# Patient Record
Sex: Female | Born: 1950 | Race: White | Hispanic: No | Smoking: Former smoker
Health system: Southern US, Community
[De-identification: ages and names within clinical notes are randomized; demographics above are authoritative.]

## PROBLEM LIST (undated history)

## (undated) DIAGNOSIS — R112 Nausea with vomiting, unspecified: Secondary | ICD-10-CM

## (undated) DIAGNOSIS — E7800 Pure hypercholesterolemia, unspecified: Secondary | ICD-10-CM

## (undated) DIAGNOSIS — F419 Anxiety disorder, unspecified: Secondary | ICD-10-CM

## (undated) DIAGNOSIS — Z87442 Personal history of urinary calculi: Secondary | ICD-10-CM

## (undated) DIAGNOSIS — Z8669 Personal history of other diseases of the nervous system and sense organs: Secondary | ICD-10-CM

## (undated) DIAGNOSIS — Z9889 Other specified postprocedural states: Secondary | ICD-10-CM

## (undated) DIAGNOSIS — I10 Essential (primary) hypertension: Secondary | ICD-10-CM

## (undated) HISTORY — PX: TONSILLECTOMY: SUR1361

## (undated) HISTORY — PX: APPENDECTOMY: SHX54

## (undated) HISTORY — PX: ABDOMINAL HYSTERECTOMY: SHX81

---

## 2013-02-18 ENCOUNTER — Other Ambulatory Visit (HOSPITAL_COMMUNITY): Payer: Self-pay | Admitting: Podiatry

## 2013-02-18 DIAGNOSIS — M25571 Pain in right ankle and joints of right foot: Secondary | ICD-10-CM

## 2013-02-18 DIAGNOSIS — M79671 Pain in right foot: Secondary | ICD-10-CM

## 2013-02-19 ENCOUNTER — Ambulatory Visit (HOSPITAL_COMMUNITY): Payer: Self-pay

## 2013-02-20 ENCOUNTER — Other Ambulatory Visit: Payer: Self-pay | Admitting: Podiatry

## 2013-02-20 ENCOUNTER — Encounter (HOSPITAL_COMMUNITY): Payer: Self-pay | Admitting: Pharmacy Technician

## 2013-02-21 NOTE — Addendum Note (Signed)
Addended by: Salmaan Patchin on: 02/21/2013 01:02 PM   Modules accepted: Orders  

## 2013-02-22 ENCOUNTER — Encounter (HOSPITAL_COMMUNITY): Payer: Self-pay

## 2013-02-22 ENCOUNTER — Ambulatory Visit (HOSPITAL_COMMUNITY)
Admission: RE | Admit: 2013-02-22 | Discharge: 2013-02-22 | Disposition: A | Discharge status: Home / Self Care | Payer: 59 | Source: Ambulatory Visit | Attending: Podiatry | Admitting: Podiatry

## 2013-02-22 ENCOUNTER — Encounter (HOSPITAL_COMMUNITY)
Admission: RE | Admit: 2013-02-22 | Discharge: 2013-02-22 | Disposition: A | Discharge status: Home / Self Care | Payer: 59 | Source: Ambulatory Visit | Attending: Podiatry | Admitting: Podiatry

## 2013-02-22 ENCOUNTER — Other Ambulatory Visit: Payer: Self-pay

## 2013-02-22 DIAGNOSIS — Z01818 Encounter for other preprocedural examination: Secondary | ICD-10-CM | POA: Insufficient documentation

## 2013-02-22 DIAGNOSIS — S93499A Sprain of other ligament of unspecified ankle, initial encounter: Secondary | ICD-10-CM | POA: Insufficient documentation

## 2013-02-22 DIAGNOSIS — X58XXXA Exposure to other specified factors, initial encounter: Secondary | ICD-10-CM | POA: Insufficient documentation

## 2013-02-22 DIAGNOSIS — Z01810 Encounter for preprocedural cardiovascular examination: Secondary | ICD-10-CM | POA: Insufficient documentation

## 2013-02-22 HISTORY — DX: Anxiety disorder, unspecified: F41.9

## 2013-02-22 HISTORY — DX: Nausea with vomiting, unspecified: R11.2

## 2013-02-22 HISTORY — DX: Essential (primary) hypertension: I10

## 2013-02-22 HISTORY — DX: Other specified postprocedural states: Z98.890

## 2013-02-22 HISTORY — DX: Pure hypercholesterolemia, unspecified: E78.00

## 2013-02-22 HISTORY — DX: Personal history of other diseases of the nervous system and sense organs: Z86.69

## 2013-02-22 LAB — BASIC METABOLIC PANEL
BUN: 14 mg/dL (ref 6–23)
Creatinine, Ser: 0.71 mg/dL (ref 0.50–1.10)
GFR calc Af Amer: >90 mL/min (ref >90)
GFR calc non Af Amer: >90 mL/min (ref >90)

## 2013-02-22 MED ORDER — CYCLOPENTOLATE-PHENYLEPHRINE OP SOLN OPTIME - NO CHARGE
OPHTHALMIC | Status: AC
  Filled 2013-02-22: qty 2

## 2013-02-22 MED ORDER — LIDOCAINE HCL 3.5 % OP GEL
OPHTHALMIC | Status: AC
  Filled 2013-02-22: qty 5

## 2013-02-22 MED ORDER — TETRACAINE HCL 0.5 % OP SOLN
OPHTHALMIC | Status: AC
  Filled 2013-02-22: qty 2

## 2013-02-22 MED ORDER — LIDOCAINE HCL (PF) 1 % IJ SOLN
INTRAMUSCULAR | Status: AC
  Filled 2013-02-22: qty 2

## 2013-02-22 MED ORDER — NEOMYCIN-POLYMYXIN-DEXAMETH 3.5-10000-0.1 OP OINT
TOPICAL_OINTMENT | OPHTHALMIC | Status: AC
  Filled 2013-02-22: qty 3.5

## 2013-02-22 NOTE — Patient Instructions (Signed)
Krista Fuller  02/22/2013   Your procedure is scheduled on:  Monday, 02/25/13  Report to [facility] at [place] [facility].  Call this number if you have problems the morning of surgery: [phone]   Remember:   Do not eat food or drink liquids after midnight.   Take these medicines the morning of surgery with A SIP OF WATER: ramipril (altace) and xanax   Do not wear jewelry, make-up or nail polish.  Do not wear lotions, powders, or perfumes. You may wear deodorant.  Do not shave 48 hours prior to surgery. Men may shave face and neck.  Do not bring valuables to the hospital.  [facility] is not responsible for any belongings or valuables.  Contacts, dentures or bridgework may not be worn into surgery.  Leave suitcase in the car. After surgery it may be brought to your room.  For patients admitted to the hospital, checkout time is 11:00 AM the day of  discharge.   Patients discharged the day of surgery will not be allowed to drive  home.  Name and phone number of your driver: brother  Special Instructions: Shower using CHG 2 nights before surgery and the night before surgery.  If you shower the day of surgery use CHG.  Use special wash - you have one bottle of CHG for all showers.  You should use approximately 1/3 of the bottle for each shower.   Please read over the following fact sheets that you were given: Pain Booklet, Surgical Site Infection Prevention, Anesthesia Post-op Instructions and Care and Recovery After Surgery   Complete Achilles Tendon Rupture Tendons are the tough, fibrous, and stretchy (elastic) tissues that connect muscle to bone. The Achilles tendon is the large cord-like structure (tendon) in the back of the leg just above the foot. It attaches the large muscles of the lower leg to the heel bone. You can feel this as the large cord just above the heel. The diagnosis of complete Achilles tendon tear (rupture) is made by examination. You are not able to stand up on the toes of the  injured side with this injury. X-rays will determine the extent of the injury. Surgical repair with casting is necessary with complete rupture of the tendon. Surgery allows the surgeon to put the tendon back together. The cast is used to allow the repair time to heal. The injury may be casted or immobilized for 6 to 10 weeks. Immobilization means that the tendon injured is kept in position with a cast or splint. Once your caregiver feels you have healed well enough, he or she will provide exercises you can do to make the injured tendon feel better (rehabilitate). HOME CARE INSTRUCTIONS   Apply ice to the injury for 15-20 minutes, 3-4 times per day. Put the ice in a plastic bag and place a towel between the bag of ice and your skin, splint, or immobilization device.  Use crutches and move about only as instructed.  Keep the leg elevated above the level of the heart (the center of the chest) at all times when not using the bathroom. Do not dangle the leg over a chair, couch, or bed. When lying down, elevate your leg on a few pillows. Elevation prevents swelling and reduces pain.  Avoid use other than gentle range of motion of the toes.  Do not drive a car until your caregiver specifically tells you it is safe to do so.  Only take over-the-counter or prescription medicines for pain, discomfort, or fever  as directed by your caregiver.  If your caregiver has given you a follow-up appointment, it is very important to keep that appointment. Not keeping the appointment could result in a chronic or permanent injury, pain, and disability. If there is any problem keeping the appointment, you must call back to this facility for assistance. SEEK MEDICAL CARE IF:   Your pain and swelling increase, or pain is uncontrolled with medications.  You develop new unexplained problems (symptoms) or an increase of the symptoms that brought you to your caregiver.  You develop an inability to move your toes or foot,  develop warmth and swelling in your foot, or begin running an unexplained fever. MAKE SURE YOU:   Understand these instructions.  Will watch your condition.  Will get help right away if you are not doing well or get worse. Document Released: 03/02/2005 Document Revised: 08/15/2011 Document Reviewed: 12/25/2007 [facility] Patient Information 2014 [place], [place]. Achilles Tendon Repair  Care After AFTER THE PROCEDURE  You will be taken to the recovery area where a nurse will watch and check your progress. Once you are awake, stable, and taking fluids well, if there are no other problems, you will be allowed to go home.  If this was done as a same day surgery, make sure to have a responsible adult with you for the first 24 hours following your surgery.  Do not drink alcohol, drive a car, use public transportation, or sign important papers for at least one day after surgery.  Do not resume physical activities until directed by your caregiver and surgeon.  Apply ice to the operative site for 15-20 minutes, 3-4 times per day for the first 2-3 days, or as directed. Put the ice in a plastic bag and place a towel between the bag of ice and your skin, splint or cast.  Only take over-the-counter or prescription medicines for pain, discomfort, or fever as directed by your caregiver.  You may resume your normal diet as directed.  Change your dressings as directed.  Use crutches and move about only as instructed.  Keep leg raised above the level of the heart (the center of the chest) at all times when not using the bathroom, etc. Do not dangle the leg over a chair, couch or bed. When lying down, raise your leg on a couple of pillows. Keeping your leg up this way prevents swelling and reduces pain.  Avoid use of your leg other than gentle range of motion with your toes. SEEK MEDICAL CARE IF:   There is redness, swelling, or increasing pain in the wound.  There is pus coming from the  wound.  There is drainage from a wound lasting longer than one day.  An unexplained oral temperature above 102 F (38.9 C) develops.  You notice a bad smell coming from the wound or dressing.  The wound edges break apart after sutures or staples have been removed.  You develop continuing nausea or vomiting. SEEK IMMEDIATE MEDICAL CARE IF:   Your pain and swelling increase or pain is uncontrolled with medicine.  You develop new, unexplained symptoms.  You have trouble moving or cannot move your toes or foot, or develop warmth and swelling in your foot, or begin running an unexplained temperature.  You develop a rash.  You have a hard time breathing.  You develop or feel you are developing any reaction or side effects to the medicines given. Document Released: 01/27/2004 Document Revised: 08/15/2011 Document Reviewed: 03/12/2008 [employer] Patient Information 2014 [place]  [place]  PATIENT INSTRUCTIONS POST-ANESTHESIA  IMMEDIATELY FOLLOWING SURGERY:  Do not drive or operate machinery for the first twenty four hours after surgery.  Do not make any important decisions for twenty four hours after surgery or while taking narcotic pain medications or sedatives.  If you develop intractable nausea and vomiting or a severe headache please notify your doctor immediately.  FOLLOW-UP:  Please make an appointment with your surgeon as instructed. You do not need to follow up with anesthesia unless specifically instructed to do so.  WOUND CARE INSTRUCTIONS (if applicable):  Keep a dry clean dressing on the anesthesia/puncture wound site if there is drainage.  Once the wound has quit draining you may leave it open to air.  Generally you should leave the bandage intact for twenty four hours unless there is drainage.  If the epidural site drains for more than 36-48 hours please call the anesthesia department.  QUESTIONS?:  Please feel free to call your physician or the hospital operator if you have  any questions, and they will be happy to assist you.

## 2013-02-25 ENCOUNTER — Ambulatory Visit (HOSPITAL_COMMUNITY): Payer: 59

## 2013-02-25 ENCOUNTER — Encounter (HOSPITAL_COMMUNITY): Payer: Self-pay | Admitting: Anesthesiology

## 2013-02-25 ENCOUNTER — Encounter (HOSPITAL_COMMUNITY): Payer: Self-pay | Admitting: *Deleted

## 2013-02-25 ENCOUNTER — Ambulatory Visit (HOSPITAL_COMMUNITY)
Admission: RE | Admit: 2013-02-25 | Discharge: 2013-02-25 | Disposition: A | Discharge status: Home / Self Care | Payer: 59 | Source: Ambulatory Visit | Attending: Podiatry | Admitting: Podiatry

## 2013-02-25 ENCOUNTER — Ambulatory Visit (HOSPITAL_COMMUNITY): Payer: 59 | Admitting: Anesthesiology

## 2013-02-25 ENCOUNTER — Encounter (HOSPITAL_COMMUNITY)
Admission: RE | Disposition: A | Discharge status: Home / Self Care | Payer: Self-pay | Source: Ambulatory Visit | Attending: Podiatry

## 2013-02-25 DIAGNOSIS — X58XXXA Exposure to other specified factors, initial encounter: Secondary | ICD-10-CM | POA: Insufficient documentation

## 2013-02-25 DIAGNOSIS — S86011D Strain of right Achilles tendon, subsequent encounter: Secondary | ICD-10-CM

## 2013-02-25 DIAGNOSIS — S93499A Sprain of other ligament of unspecified ankle, initial encounter: Secondary | ICD-10-CM | POA: Insufficient documentation

## 2013-02-25 DIAGNOSIS — I10 Essential (primary) hypertension: Secondary | ICD-10-CM | POA: Insufficient documentation

## 2013-02-25 HISTORY — PX: ACHILLES TENDON SURGERY: SHX542

## 2013-02-25 SURGERY — REPAIR, TENDON, ACHILLES
Anesthesia: General | Site: Foot | Laterality: Right | Wound class: Clean

## 2013-02-25 MED ORDER — CEFAZOLIN SODIUM-DEXTROSE 2-3 GM-% IV SOLR
2.0000 g | INTRAVENOUS | Status: AC
  Administered 2013-02-25: 2 g via INTRAVENOUS

## 2013-02-25 MED ORDER — FENTANYL CITRATE 0.05 MG/ML IJ SOLN
INTRAMUSCULAR | Status: DC | PRN
  Administered 2013-02-25 (×2): 50 ug via INTRAVENOUS
  Administered 2013-02-25 (×3): 25 ug via INTRAVENOUS
  Administered 2013-02-25 (×2): 50 ug via INTRAVENOUS
  Administered 2013-02-25 (×3): 25 ug via INTRAVENOUS

## 2013-02-25 MED ORDER — FENTANYL CITRATE 0.05 MG/ML IJ SOLN
INTRAMUSCULAR | Status: AC
  Filled 2013-02-25: qty 2

## 2013-02-25 MED ORDER — PROPOFOL 10 MG/ML IV EMUL
INTRAVENOUS | Status: AC
  Filled 2013-02-25: qty 20

## 2013-02-25 MED ORDER — SODIUM CHLORIDE 0.9 % IR SOLN
Status: DC | PRN
  Administered 2013-02-25: 1000 mL

## 2013-02-25 MED ORDER — SCOPOLAMINE 1 MG/3DAYS TD PT72
MEDICATED_PATCH | TRANSDERMAL | Status: AC
  Filled 2013-02-25: qty 1

## 2013-02-25 MED ORDER — SCOPOLAMINE 1 MG/3DAYS TD PT72
1.0000 | MEDICATED_PATCH | Freq: Once | TRANSDERMAL | Status: DC
  Administered 2013-02-25: 1.5 mg via TRANSDERMAL

## 2013-02-25 MED ORDER — FENTANYL CITRATE 0.05 MG/ML IJ SOLN
25.0000 ug | INTRAMUSCULAR | Status: DC | PRN
  Administered 2013-02-25 (×3): 50 ug via INTRAVENOUS

## 2013-02-25 MED ORDER — BUPIVACAINE HCL (PF) 0.5 % IJ SOLN
INTRAMUSCULAR | Status: AC
  Filled 2013-02-25: qty 30

## 2013-02-25 MED ORDER — ONDANSETRON HCL 4 MG/2ML IJ SOLN: 4.0000 mg | Freq: Once | INTRAMUSCULAR | Status: DC | PRN

## 2013-02-25 MED ORDER — ONDANSETRON HCL 4 MG/2ML IJ SOLN
4.0000 mg | Freq: Once | INTRAMUSCULAR | Status: AC
  Administered 2013-02-25: 4 mg via INTRAVENOUS

## 2013-02-25 MED ORDER — DEXAMETHASONE SODIUM PHOSPHATE 4 MG/ML IJ SOLN: 4.0000 mg | Freq: Once | INTRAMUSCULAR | Status: DC

## 2013-02-25 MED ORDER — ONDANSETRON HCL 4 MG/2ML IJ SOLN
INTRAMUSCULAR | Status: AC
  Filled 2013-02-25: qty 2

## 2013-02-25 MED ORDER — PROPOFOL 10 MG/ML IV BOLUS
INTRAVENOUS | Status: DC | PRN
  Administered 2013-02-25: 150 mg via INTRAVENOUS

## 2013-02-25 MED ORDER — MIDAZOLAM HCL 2 MG/2ML IJ SOLN
INTRAMUSCULAR | Status: AC
  Filled 2013-02-25: qty 2

## 2013-02-25 MED ORDER — LIDOCAINE HCL (CARDIAC) 20 MG/ML IV SOLN
INTRAVENOUS | Status: DC | PRN
  Administered 2013-02-25: 30 mg via INTRAVENOUS

## 2013-02-25 MED ORDER — DEXAMETHASONE SODIUM PHOSPHATE 4 MG/ML IJ SOLN
INTRAMUSCULAR | Status: AC
  Filled 2013-02-25: qty 1

## 2013-02-25 MED ORDER — ROCURONIUM BROMIDE 50 MG/5ML IV SOLN
INTRAVENOUS | Status: AC
  Filled 2013-02-25: qty 1

## 2013-02-25 MED ORDER — BUPIVACAINE HCL (PF) 0.5 % IJ SOLN
INTRAMUSCULAR | Status: DC | PRN
  Administered 2013-02-25: 20 mL

## 2013-02-25 MED ORDER — GLYCOPYRROLATE 0.2 MG/ML IJ SOLN
INTRAMUSCULAR | Status: AC
  Filled 2013-02-25: qty 2

## 2013-02-25 MED ORDER — MIDAZOLAM HCL 2 MG/2ML IJ SOLN
1.0000 mg | INTRAMUSCULAR | Status: DC | PRN
Start: 2013-02-25 — End: 2013-02-25
  Administered 2013-02-25: 2 mg via INTRAVENOUS

## 2013-02-25 MED ORDER — LACTATED RINGERS IV SOLN
INTRAVENOUS | Status: DC
  Administered 2013-02-25: 07:00:00 via INTRAVENOUS

## 2013-02-25 MED ORDER — FENTANYL CITRATE 0.05 MG/ML IJ SOLN
INTRAMUSCULAR | Status: AC
  Filled 2013-02-25: qty 5

## 2013-02-25 MED ORDER — ROCURONIUM BROMIDE 100 MG/10ML IV SOLN
INTRAVENOUS | Status: DC | PRN
  Administered 2013-02-25: 35 mg via INTRAVENOUS

## 2013-02-25 MED ORDER — NEOSTIGMINE METHYLSULFATE 1 MG/ML IJ SOLN
INTRAMUSCULAR | Status: AC
  Filled 2013-02-25: qty 1

## 2013-02-25 MED ORDER — CEFAZOLIN SODIUM 1-5 GM-% IV SOLN
INTRAVENOUS | Status: AC
  Filled 2013-02-25: qty 100

## 2013-02-25 MED ORDER — LIDOCAINE HCL (PF) 1 % IJ SOLN
INTRAMUSCULAR | Status: AC
  Filled 2013-02-25: qty 5

## 2013-02-25 SURGICAL SUPPLY — 54 items
BAG HAMPER (MISCELLANEOUS) ×2 IMPLANT
BANDAGE CONFORM 2  STR LF (GAUZE/BANDAGES/DRESSINGS) ×2 IMPLANT
BANDAGE ELASTIC 4 VELCRO NS (GAUZE/BANDAGES/DRESSINGS) ×2 IMPLANT
BANDAGE ELASTIC 6 VELCRO NS (GAUZE/BANDAGES/DRESSINGS) ×2 IMPLANT
BANDAGE ESMARK 4X12 BL STRL LF (DISPOSABLE) ×1 IMPLANT
BANDAGE GAUZE ELAST BULKY 4 IN (GAUZE/BANDAGES/DRESSINGS) ×2 IMPLANT
BENZOIN TINCTURE PRP APPL 2/3 (GAUZE/BANDAGES/DRESSINGS) ×2 IMPLANT
BLADE AVERAGE 25X9 (BLADE) ×2 IMPLANT
BLADE SURG 15 STRL LF DISP TIS (BLADE) ×2 IMPLANT
BLADE SURG 15 STRL SS (BLADE) ×2
BNDG ESMARK 4X12 BLUE STRL LF (DISPOSABLE) ×2
CHLORAPREP W/TINT 26ML (MISCELLANEOUS) ×2 IMPLANT
CLOTH BEACON ORANGE TIMEOUT ST (SAFETY) ×2 IMPLANT
COVER LIGHT HANDLE STERIS (MISCELLANEOUS) ×4 IMPLANT
CUFF TOURNIQUET SINGLE 34IN LL (TOURNIQUET CUFF) ×2 IMPLANT
DECANTER SPIKE VIAL GLASS SM (MISCELLANEOUS) ×4 IMPLANT
DRAPE OEC MINIVIEW 54X84 (DRAPES) ×2 IMPLANT
DRSG ADAPTIC 3X8 NADH LF (GAUZE/BANDAGES/DRESSINGS) ×2 IMPLANT
ELECT REM PT RETURN 9FT ADLT (ELECTROSURGICAL) ×2
ELECTRODE REM PT RTRN 9FT ADLT (ELECTROSURGICAL) ×1 IMPLANT
GLOVE BIO SURGEON STRL SZ7.5 (GLOVE) ×4 IMPLANT
GLOVE BIOGEL PI IND STRL 7.0 (GLOVE) ×3 IMPLANT
GLOVE BIOGEL PI INDICATOR 7.0 (GLOVE) ×3
GLOVE ECLIPSE 7.0 STRL STRAW (GLOVE) ×2 IMPLANT
GLOVE SS BIOGEL STRL SZ 6.5 (GLOVE) ×1 IMPLANT
GLOVE SUPERSENSE BIOGEL SZ 6.5 (GLOVE) ×1
GOWN STRL REIN XL XLG (GOWN DISPOSABLE) ×8 IMPLANT
GRAFT ACHILLES TENDON (Bone Implant) ×2 IMPLANT
IMPL SYS BIOCOMP ACH SPEED (Anchor) ×1 IMPLANT
IMPLANT SYS BIOCOMP ACH SPEED (Anchor) ×2 IMPLANT
KIT ROOM TURNOVER AP CYSTO (KITS) ×2 IMPLANT
MANIFOLD NEPTUNE II (INSTRUMENTS) ×2 IMPLANT
NEEDLE HYPO 27GX1-1/4 (NEEDLE) ×4 IMPLANT
NEEDLE MAYO 6 CRC TAPER PT (NEEDLE) ×2 IMPLANT
NS IRRIG 1000ML POUR BTL (IV SOLUTION) ×2 IMPLANT
PACK BASIC LIMB (CUSTOM PROCEDURE TRAY) ×2 IMPLANT
PAD ARMBOARD 7.5X6 YLW CONV (MISCELLANEOUS) ×2 IMPLANT
PAD CAST 4YDX4 CTTN HI CHSV (CAST SUPPLIES) ×2 IMPLANT
PADDING CAST COTTON 4X4 STRL (CAST SUPPLIES) ×2
RASP SM TEAR CROSS CUT (RASP) ×2 IMPLANT
SET BASIN LINEN APH (SET/KITS/TRAYS/PACK) ×2 IMPLANT
SPLINT J IMMOBILIZER 4X20FT (CAST SUPPLIES) ×1 IMPLANT
SPLINT J PLASTER J 4INX20Y (CAST SUPPLIES) ×1
SPONGE GAUZE 4X4 12PLY (GAUZE/BANDAGES/DRESSINGS) ×2 IMPLANT
SPONGE LAP 18X18 X RAY DECT (DISPOSABLE) ×4 IMPLANT
STRIP CLOSURE SKIN 1/2X4 (GAUZE/BANDAGES/DRESSINGS) ×2 IMPLANT
SUT FIBERWIRE #2 38 T-5 BLUE (SUTURE) ×2
SUT PROLENE 4 0 PS 2 18 (SUTURE) ×2 IMPLANT
SUT VIC AB 3-0 SH 27 (SUTURE) ×1
SUT VIC AB 3-0 SH 27X BRD (SUTURE) ×1 IMPLANT
SUT VIC AB 4-0 PS2 27 (SUTURE) ×2 IMPLANT
SUT VICRYL AB 3-0 FS1 BRD 27IN (SUTURE) ×2 IMPLANT
SUTURE FIBERWR #2 38 T-5 BLUE (SUTURE) ×1 IMPLANT
SYR CONTROL 10ML LL (SYRINGE) ×4 IMPLANT

## 2013-02-25 NOTE — Op Note (Signed)
OPERATIVE NOTE  DATE OF PROCEDURE:  02/25/2013  SURGEON:   Dallas Schimke, DPM  OR STAFF:   Circulator: Lennox Pippins, RN Scrub Person: Diana Eves, CST RN First Assistant: Eliane Decree Page, RN OR Clinical Technician: Raelyn Number, OCT   PREOPERATIVE DIAGNOSIS:   Achilles tendon rupture, right foot.  POSTOPERATIVE DIAGNOSIS: Same  PROCEDURE: Repair of Achilles tendon rupture with allograft, right foot.  ANESTHESIA:  General   HEMOSTASIS:   Pneumatic thigh tourniquet set at 300 mmHg  ESTIMATED BLOOD LOSS:   Minimal (<5 cc)  MATERIALS USED:  Achilles tendon allograft Arthrex SpeedBridge  INJECTABLES: Marcaine 0.5% plain; 20mL  PATHOLOGY:   Achilles tendon with calcification  COMPLICATIONS:   None  INDICATIONS:  Achilles tendon rupture of the right lower extremity as confirmed by MRI.  DESCRIPTION OF THE PROCEDURE:   The patient was brought to the operating room.  After general anesthesia was administered, the patient was placed in the semi-prone position.  All bony prominences were well-padded.  A pneumatic thigh tourniquet was placed about the patient's right thigh.  The foot was scrubbed, prepped and draped in usual sterile manner.  Attention was directed to the posterior aspect of the right right lower leg.  0.5% Marcaine plain was injected proximal to the surgical site.  A linear longitudinal incision was then made along the posterior aspect of the right lower leg and heel.  Dissection was continued deep down to the level of the peritenon.  An incision was made in the peritenon.  The peritenon was reflected thus exposing the achilles tendon.  The Achilles tendon was found to have ruptured from its point of insertion along the posterior aspect of calcaneus.  The ankle was placed in neutral position and the Achilles tendon was found to be retracted 3.6 centimeters from its insertion point.  The distal aspect of the tendon was comprised of degenerative  tendon with areas of ossification.  The diseased portion of tendon was debrided and sent to pathology for evaluation.  Following debridement of the diseased portion of tendon, the defect measured 4.9 cm.  It was determined that an allograft would be needed to span the defect.  An Achilles tendon graft was reconstituted following standard principles and techniques.  Attention was directed to the Achilles tendon.  The tendon was sutured with size 2 FiberWire in a modified Krakw stitch.  The allograft was measured and cut to fill the defect while placing the foot under physiologic tension.  The allograft was secured to the posterior aspect of the calcaneus using Arthrex SpeedBridge.  The graft was then sutured using size 2 FiberWire in a modified Krakw stitch.  The graft was secured to the Achilles tendon.  The repair was reinforced using size 2 FiberWire.  The wound was irrigated with copious amounts of sterile irrigant.  The peritenon was reapproximated using 3-0 Vicryl in a simple suture technique.  The subcutaneous structures reapproximated using 3-0 Vicryl in a simple suture technique.  The skin was reapproximated using 4-0 Vicryl in a running subcuticular manner.  The incision closure was reinforced with 3-0 Prolene.  The incision closure was reinforced with Steri-Strips.  A sterile compressive dressing was applied to the right foot.  The pneumatic thigh tourniquet was deflated and a prompt hyperemic response was noted to all digits of the right foot.  A posterior splint was then applied to the right lower extremity.  It was secured using 4 inch and 6 inch Ace wraps.  The  patient tolerated the procedure well.  The patient was then transferred to PACU with vital signs stable and vascular status intact to all toes of the operative foot.  Following a period of postoperative monitoring, the patient will be discharged home.

## 2013-02-25 NOTE — Brief Op Note (Addendum)
BRIEF OPERATIVE NOTE  SURGEON:   Dallas Schimke, DPM  OR STAFF:   Circulator: Lennox Pippins, RN Scrub Person: Diana Eves, CST RN First Assistant: Eliane Decree Page, RN OR Clinical Technician: Raelyn Number, OCT   PREOPERATIVE DIAGNOSIS:   Achilles tendon rupture, right foot.  POSTOPERATIVE DIAGNOSIS: Same  PROCEDURE: Repair of Achilles tendon rupture with allograft, right foot.  ANESTHESIA:  General   HEMOSTASIS:   Pneumatic thigh tourniquet set at 300 mmHg  ESTIMATED BLOOD LOSS:   Minimal (<5 cc)  MATERIALS USED:  Achilles tendon allograft  INJECTABLES: Marcaine 0.5% plain; 20mL  PATHOLOGY:   Achilles tendon with calcification Arthrex SpeedBridge  COMPLICATIONS:   None  DICTATION:  Office manager and Note written in [employer]

## 2013-02-25 NOTE — Anesthesia Preprocedure Evaluation (Signed)
Anesthesia Evaluation  Patient identified by MRN, date of birth, ID band Patient awake    Reviewed: Allergy & Precautions, H&P , NPO status , Patient's Chart, lab work & pertinent test results  History of Anesthesia Complications (+) PONV  Airway Mallampati: I TM Distance: >3 FB Neck ROM: Full    Dental  (+) Teeth Intact   Pulmonary neg pulmonary ROS,  breath sounds clear to auscultation        Cardiovascular hypertension, Pt. on medications Rhythm:Regular Rate:Normal     Neuro/Psych Anxiety    GI/Hepatic negative GI ROS,   Endo/Other    Renal/GU      Musculoskeletal   Abdominal   Peds  Hematology   Anesthesia Other Findings   Reproductive/Obstetrics                           Anesthesia Physical Anesthesia Plan  ASA: II  Anesthesia Plan: General   Post-op Pain Management:    Induction: Intravenous  Airway Management Planned: Oral ETT  Additional Equipment:   Intra-op Plan:   Post-operative Plan: Extubation in OR  Informed Consent: I have reviewed the patients History and Physical, chart, labs and discussed the procedure including the risks, benefits and alternatives for the proposed anesthesia with the patient or authorized representative who has indicated his/her understanding and acceptance.     Plan Discussed with:   Anesthesia Plan Comments:         Anesthesia Quick Evaluation

## 2013-02-25 NOTE — Anesthesia Postprocedure Evaluation (Signed)
  Anesthesia Post-op Note  Patient: Krista Fuller  Procedure(s) Performed: Procedure(s): REPAIR OF ACHILLES TENDON RUPTURE WITH APPLICATION OF GRAFT RIGHT FOOT  (Right)  Patient Location: PACU  Anesthesia Type:General  Level of Consciousness: awake, alert  and oriented  Airway and Oxygen Therapy: Patient Spontanous Breathing and Patient connected to face mask oxygen  Post-op Pain: none  Post-op Assessment: Post-op Vital signs reviewed, Patient's Cardiovascular Status Stable, Respiratory Function Stable, Patent Airway and No signs of Nausea or vomiting  Post-op Vital Signs: Reviewed and stable  Complications: No apparent anesthesia complications

## 2013-02-25 NOTE — Transfer of Care (Signed)
Immediate Anesthesia Transfer of Care Note  Patient: Krista Fuller  Procedure(s) Performed: Procedure(s): REPAIR OF ACHILLES TENDON RUPTURE WITH APPLICATION OF GRAFT RIGHT FOOT  (Right)  Patient Location: PACU  Anesthesia Type:General  Level of Consciousness: awake, alert  and oriented  Airway & Oxygen Therapy: Patient Spontanous Breathing and Patient connected to face mask oxygen  Post-op Assessment: Report given to PACU RN  Post vital signs: Reviewed and stable  Complications: No apparent anesthesia complications

## 2013-02-25 NOTE — H&P (Signed)
HISTORY AND PHYSICAL INTERVAL NOTE:  02/25/2013  7:26 AM  Krista Fuller  has presented today for surgery, with the diagnosis of achilles tendon rupture right foot.  The various methods of treatment have been discussed with the patient.  No guarantees were given.  After consideration of risks, benefits and other options for treatment, the patient has consented to surgery.  I have reviewed the patients' chart and labs.    Patient Vitals for the past 24 hrs:  BP Temp Temp src Pulse Resp SpO2 Height Weight  02/25/13 0656 121/83 mmHg 97.6 F (36.4 C) Oral 81 14 99 % 5\' 3"  (1.6 m) 82.101 kg (181 lb)    A history and physical examination was performed in my office.  The patient was reexamined.  There have been no changes to this history and physical examination.  Dallas Schimke, DPM

## 2013-02-25 NOTE — Anesthesia Procedure Notes (Signed)
Procedure Name: Intubation Date/Time: 02/25/2013 7:46 AM Performed by: Glynn Octave E Pre-anesthesia Checklist: Patient identified, Patient being monitored, Timeout performed, Emergency Drugs available and Suction available Patient Re-evaluated:Patient Re-evaluated prior to inductionOxygen Delivery Method: Circle System Utilized Preoxygenation: Pre-oxygenation with 100% oxygen Intubation Type: IV induction Ventilation: Mask ventilation without difficulty Laryngoscope Size: Mac and 3 Grade View: Grade I Tube type: Oral Tube size: 7.0 mm Number of attempts: 1 Airway Equipment and Method: stylet Placement Confirmation: ETT inserted through vocal cords under direct vision,  positive ETCO2 and breath sounds checked- equal and bilateral Secured at: 21 cm Tube secured with: Tape Dental Injury: Teeth and Oropharynx as per pre-operative assessment

## 2013-02-26 ENCOUNTER — Encounter (HOSPITAL_COMMUNITY): Payer: Self-pay | Admitting: Podiatry

## 2017-02-01 ENCOUNTER — Other Ambulatory Visit (HOSPITAL_COMMUNITY): Payer: Self-pay | Admitting: Podiatry

## 2017-02-01 DIAGNOSIS — S86312A Strain of muscle(s) and tendon(s) of peroneal muscle group at lower leg level, left leg, initial encounter: Secondary | ICD-10-CM

## 2017-02-01 DIAGNOSIS — S86012S Strain of left Achilles tendon, sequela: Secondary | ICD-10-CM

## 2017-02-09 ENCOUNTER — Ambulatory Visit (HOSPITAL_COMMUNITY): Payer: Self-pay

## 2017-02-10 ENCOUNTER — Other Ambulatory Visit: Payer: Self-pay | Admitting: Podiatry

## 2017-02-14 ENCOUNTER — Other Ambulatory Visit (HOSPITAL_COMMUNITY): Payer: Self-pay

## 2017-02-14 NOTE — Patient Instructions (Signed)
Krista PouJudy Fuller  02/14/2017     @[email or web address] @   Your procedure is scheduled on  02/22/2017   [facility] at  1030  A.M.  Call this number if you have problems the morning of surgery:  [phone]   Remember:  Do not eat food or drink liquids after midnight.  Take these medicines the morning of surgery with A SIP OF WATER  Xanax, altace.   Do not wear jewelry, make-up or nail polish.  Do not wear lotions, powders, or perfumes, or deoderant.  Do not shave 48 hours prior to surgery.  Men may shave face and neck.  Do not bring valuables to the hospital.  [facility] any belongings or valuables.  Contacts, dentures or bridgework may not be worn into surgery.  Leave your suitcase in the car.  After surgery it may be brought to your room.  For patients admitted to the hospital, discharge time will be determined by your treatment team.  Patients discharged the day of surgery will not be allowed to drive home.   Name and phone number of your driver:   family Special instructions:  None  Please read over the following fact sheets that you were given. Anesthesia Post-op Instructions and Care and Recovery After Surgery       Achilles Tendon Rupture Surgery The Achilles tendon is a rope-like cord of tissue that connects the lower leg muscles to the heel. Achilles tendon repair is a surgery to repair an Achilles tendon that has been torn (ruptured). During the surgery the torn ends of the tendon are reconnected. This procedure is typically done in an outpatient surgery center. It usually takes 30 to 60 minutes to complete. The surgery is usually successful, but the recovery period can be long. Tell a health care provider about:  Any allergies you have.  All medicines you are taking, including vitamins, herbs, eye drops, creams, and over-the-counter medicines.  Any problems you or family members have had with anesthetic  medicines.  Any blood disorders you have.  Any surgeries you have had.  Any medical conditions you have, including any skin conditions or infections you develop before surgery.  Whether you are pregnant or may be pregnant. What are the risks? Generally, this is a safe procedure. However, problems may occur, including:  Infection.  Bleeding.  Allergic reaction to medicines or dyes.  Blood clots.  Delayed healing.  Scarring.  Damage to other structures or organs, including damage to the nerve, causing numbness.  Re-rupture of the tendon (rare).  What happens before the procedure? Staying hydrated Follow instructions from your health care provider about hydration, which may include:  Up to 2 hours before the procedure - you may continue to drink clear liquids, such as water, clear fruit juice, black coffee, and plain tea.  Eating and drinking restrictions Follow instructions from your health care provider about eating and drinking, which may include:  8 hours before the procedure - stop eating heavy meals or foods such as meat, fried foods, or fatty foods.  6 hours before the procedure - stop eating light meals or foods, such as toast or cereal.  6 hours before the procedure - stop drinking milk or drinks that contain milk.  2 hours before the procedure - stop drinking clear liquids.  Medicines  Ask your health care provider about: ? Changing or stopping your regular medicines. This is especially important if you are  taking diabetes medicines or blood thinners. ? Taking medicines such as aspirin and ibuprofen. These medicines can thin your blood. Do not take these medicines before your procedure if your health care provider instructs you not to.  You may be given antibiotic medicine to help prevent infection. General instructions  Your health care provider will examine the area from your lower leg to your heel.  Your health care provider may order tests such as  ultrasound or MRI, and may perform exams to check if: ? You can point your toes up and down. ? Your foot is in proper alignment.  Do not use any products that contain nicotine or tobacco, such as cigarettes and e-cigarettes. If you need help quitting, ask your health care provider.  Shower or bathe on either the night before the surgery or the morning of the surgery.  Plan to have someone take you home from the hospital or clinic.  Ask your health care provider how your surgical site will be marked or identified. What happens during the procedure?  To reduce your risk of infection: ? Your health care team will wash or sanitize their hands. ? Your skin will be washed with soap. ? Hair may be removed from the surgical area. ? A drape will be positioned around your lower leg.  You will be given one or more of the following: ? A medicine to help you relax (sedative). ? A medicine that is injected into an area of your body to numb everything below the injection site (regional anesthetic). ? A medicine to make you fall asleep (general anesthetic).  The surgeon will make an incision on the back side of your lower leg.  The torn ends of your tendon will be stitched back together.  The incisions will be closed with stitches (sutures) or staples.  A bandage (dressing) will be applied over the incision. The procedure may vary among health care providers and hospitals. What happens after the procedure?  Your blood pressure, heart rate, breathing rate, and blood oxygen level will be monitored until the medicines you were given have worn off.  You will feel some pain when the numbing medicine wears off. Your health care provider will prescribe pain medicine for you to take at home.  Your leg may be put in a cast or splint.  You will be given instructions to keep your leg above the level of your heart to reduce swelling and pain.  You will not be allowed to put weight on your leg.  You  will need to use crutches or another type of walking aid to keep weight off your leg.  You may continue to receive antibiotic medicine. Summary  The Achilles tendon is a rope-like cord of tissue that connects the lower leg muscles to the heel.  Achilles tendon repair is a surgery to repair an Achilles tendon that has been ruptured.  Follow your health care provider's instructions before the procedure, including instructions on what to eat and drink and whether to stop taking your regular medicines.  After your procedure, you will need to use crutches or another type of walking aid to keep weight off your leg. This information is not intended to replace advice given to you by your health care provider. Make sure you discuss any questions you have with your health care provider. Document Released: 05/28/2013 Document Revised: 05/09/2016 Document Reviewed: 05/09/2016 Elsevier Interactive Patient Education  2017 [employer]  Achilles Tendon Rupture Surgery, Care After This sheet gives you  information about how to care for yourself after your procedure. Your health care provider may also give you more specific instructions. If you have problems or questions, contact your health care provider. What can I expect after the procedure? After the procedure, it is common to have:  Numbness in your foot. This should go away within 24 hours.  Pain.  It may take 6 months before you can return to your regular activity level. However, complete recovery can take a year or longer. Follow these instructions at home: If you have a splint:  Wear the splint as told by your health care provider. Remove it only as told by your health care provider.  Loosen the splint if your toes tingle, become numb, or turn cold and blue.  Keep the splint clean.  If the splint is not waterproof: ? Do not let it get wet. ? Cover it with a watertight covering when you take a bath or a shower. If you have a cast:  Do  not put pressure on any part of the cast or splint until it is fully hardened. This may take several hours.  Do not stick anything inside the cast to scratch your skin. Doing that increases your risk of infection.  Check the skin around the cast every day. Tell your health care provider about any concerns.  You may put lotion on dry skin around the edges of the cast. Do not put lotion on the skin underneath the cast.  Keep the cast clean.  If the cast is not waterproof: ? Do not let it get wet. ? Cover it with a watertight covering when you take a bath or a shower. Incision care  Follow instructions from your health care provider about how to take care of your incision. Make sure you: ? Wash your hands with soap and water before you change your bandage (dressing). If soap and water are not available, use hand sanitizer. ? Change your dressing as told by your health care provider. ? Leave stitches (sutures) or staples in place. These skin closures may need to stay in place for 2 weeks or longer.  Check your incision area every day for signs of infection. Check for: ? Redness, swelling, or pain. ? Fluid or blood. ? Warmth. ? Pus or a bad smell. Medicines  Take pain medicines only as told by your health care provider.  Do not take over-the-counter medicines unless your health care provider says it is okay.  To prevent or treat constipation while you are taking prescription pain medicine, your health care provider may recommend that you: ? Drink enough fluid to keep your urine clear or pale yellow. ? Take over-the-counter or prescription medicines. ? Eat foods that are high in fiber, such as fresh fruits and vegetables, whole grains, and beans. ? Limit foods that are high in fat and processed sugars, such as fried and sweet foods. Activity  Do not walk on or put weight on your injured leg.  Use crutches or another walking aid.  Follow your health care provider's instructions on  how to move your ankle and how much weight you can put on your leg. If you have a cast or splint, you should receive these instructions after it is removed.  Follow your rehabilitation plan. Do exercises as told by your health care provider or physical therapist. Between 2-6 weeks after surgery, your surgeon may recommend: ? Putting some weight on your leg while wearing your walking boot or cast. ? Doing  ankle motion exercises.  Around 6 weeks after surgery, your surgeon may recommend: ? Putting some weight on your leg without the assistance of a boot or cast. ? Starting physical therapy to improve ankle motion and to strengthen muscles in your leg.  Avoid stretching your Achilles tendon for at least 6 months or as told by your physical therapist.  Do not return to physical activity or sports until you are cleared by your health care provider or physical therapist. Driving  Do not drive or operate heavy machinery while taking prescription pain medicine.  If you have a cast, splint, or boot on your leg, ask your health care provider when it is safe for you to drive. General instructions  Raise (elevate) your leg above the level of your heart while you are sitting or lying down.  Do not take baths, swim or use a hot tub until your health care provider approves.  If directed, put ice on the injured area: ? If you have a removable splint, remove it as told by your health care provider. ? Put ice in a plastic bag. ? Place a towel between your skin and the bag. ? Leave the ice on for 20 minutes, 2-3 times a day.  See a physical therapist if directed by your health care provider. A physical therapist can help you regain ankle motion and strengthen your leg muscles.  Keep all follow-up visits as told by your health care provider. This is important. If you have a cast or splint, you should see your health care provider about 2 weeks after surgery to have it removed. If you have stitches, your  health care provider will also take them out during this time. Contact a health care provider if:  You have chills.  You have a fever.  Your pain medicine is not working.  You have persistent numbness or burning.  You have redness, swelling, or pain around your incision.  You have fluid or blood coming from your incision.  Your incision feels warm to the touch.  You have pus or a bad smell coming from your incision.  You feel like your cast or splint is too tight.  You are unable to wiggle your toes.  You have a cast or splint and have increased pain.  You have numbness or tingling in your foot or toes.  You notice cracks or soft spots on your cast. Get help right away if:  You have swelling, pain, or numbness that is getting worse.  You are bleeding through your cast or splint.  You have chest pain.  You have trouble breathing. Summary  After your procedure, it is common to have pain and numbness in your foot. Numbness should go away within 24 hours after surgery.  If you have a splint, loosen it if your toes tingle, become numb, or turn cold and blue. Keep the splint clean and dry.  If you have a cast, do not put pressure on it until it hardens. To avoid infections, do not stick objects under the cast to scratch the skin and do not put lotion under the cast. Keep the cast clean and dry.  Follow your health care provider's instructions about caring for your incision, avoiding some activities, taking medicines, and keeping follow-up visits. This information is not intended to replace advice given to you by your health care provider. Make sure you discuss any questions you have with your health care provider. Document Released: 01/27/2004 Document Revised: 05/06/2016 Document Reviewed: 05/06/2016  Elsevier Interactive Patient Education  2017 [employer]  Monitored Anesthesia Care Anesthesia is a term that refers to techniques, procedures, and medicines that help a  person stay safe and comfortable during a medical procedure. Monitored anesthesia care, or sedation, is one type of anesthesia. Your anesthesia specialist may recommend sedation if you will be having a procedure that does not require you to be unconscious, such as:  Cataract surgery.  A dental procedure.  A biopsy.  A colonoscopy.  During the procedure, you may receive a medicine to help you relax (sedative). There are three levels of sedation:  Mild sedation. At this level, you may feel awake and relaxed. You will be able to follow directions.  Moderate sedation. At this level, you will be sleepy. You may not remember the procedure.  Deep sedation. At this level, you will be asleep. You will not remember the procedure.  The more medicine you are given, the deeper your level of sedation will be. Depending on how you respond to the procedure, the anesthesia specialist may change your level of sedation or the type of anesthesia to fit your needs. An anesthesia specialist will monitor you closely during the procedure. Let your health care provider know about:  Any allergies you have.  All medicines you are taking, including vitamins, herbs, eye drops, creams, and over-the-counter medicines.  Any use of steroids (by mouth or as a cream).  Any problems you or family members have had with sedatives and anesthetic medicines.  Any blood disorders you have.  Any surgeries you have had.  Any medical conditions you have, such as sleep apnea.  Whether you are pregnant or may be pregnant.  Any use of cigarettes, alcohol, or street drugs. What are the risks? Generally, this is a safe procedure. However, problems may occur, including:  Getting too much medicine (oversedation).  Nausea.  Allergic reaction to medicines.  Trouble breathing. If this happens, a breathing tube may be used to help with breathing. It will be removed when you are awake and breathing on your own.  Heart  trouble.  Lung trouble.  Before the procedure Staying hydrated Follow instructions from your health care provider about hydration, which may include:  Up to 2 hours before the procedure - you may continue to drink clear liquids, such as water, clear fruit juice, black coffee, and plain tea.  Eating and drinking restrictions Follow instructions from your health care provider about eating and drinking, which may include:  8 hours before the procedure - stop eating heavy meals or foods such as meat, fried foods, or fatty foods.  6 hours before the procedure - stop eating light meals or foods, such as toast or cereal.  6 hours before the procedure - stop drinking milk or drinks that contain milk.  2 hours before the procedure - stop drinking clear liquids.  Medicines Ask your health care provider about:  Changing or stopping your regular medicines. This is especially important if you are taking diabetes medicines or blood thinners.  Taking medicines such as aspirin and ibuprofen. These medicines can thin your blood. Do not take these medicines before your procedure if your health care provider instructs you not to.  Tests and exams  You will have a physical exam.  You may have blood tests done to show: ? How well your kidneys and liver are working. ? How well your blood can clot.  General instructions  Plan to have someone take you home from the hospital or clinic.  If you will be going home right after the procedure, plan to have someone with you for 24 hours.  What happens during the procedure?  Your blood pressure, heart rate, breathing, level of pain and overall condition will be monitored.  An IV tube will be inserted into one of your veins.  Your anesthesia specialist will give you medicines as needed to keep you comfortable during the procedure. This may mean changing the level of sedation.  The procedure will be performed. After the procedure  Your blood  pressure, heart rate, breathing rate, and blood oxygen level will be monitored until the medicines you were given have worn off.  Do not drive for 24 hours if you received a sedative.  You may: ? Feel sleepy, clumsy, or nauseous. ? Feel forgetful about what happened after the procedure. ? Have a sore throat if you had a breathing tube during the procedure. ? Vomit. This information is not intended to replace advice given to you by your health care provider. Make sure you discuss any questions you have with your health care provider. Document Released: 02/16/2005 Document Revised: 10/30/2015 Document Reviewed: 09/13/2015 Elsevier Interactive Patient Education  2018 [employer] Monitored Anesthesia Care, Care After These instructions provide you with information about caring for yourself after your procedure. Your health care provider may also give you more specific instructions. Your treatment has been planned according to current medical practices, but problems sometimes occur. Call your health care provider if you have any problems or questions after your procedure. What can I expect after the procedure? After your procedure, it is common to:  Feel sleepy for several hours.  Feel clumsy and have poor balance for several hours.  Feel forgetful about what happened after the procedure.  Have poor judgment for several hours.  Feel nauseous or vomit.  Have a sore throat if you had a breathing tube during the procedure.  Follow these instructions at home: For at least 24 hours after the procedure:   Do not: ? Participate in activities in which you could fall or become injured. ? Drive. ? Use heavy machinery. ? Drink alcohol. ? Take sleeping pills or medicines that cause drowsiness. ? Make important decisions or sign legal documents. ? Take care of children on your own.  Rest. Eating and drinking  Follow the diet that is recommended by your health care provider.  If you  vomit, drink water, juice, or soup when you can drink without vomiting.  Make sure you have little or no nausea before eating solid foods. General instructions  Have a responsible adult stay with you until you are awake and alert.  Take over-the-counter and prescription medicines only as told by your health care provider.  If you smoke, do not smoke without supervision.  Keep all follow-up visits as told by your health care provider. This is important. Contact a health care provider if:  You keep feeling nauseous or you keep vomiting.  You feel light-headed.  You develop a rash.  You have a fever. Get help right away if:  You have trouble breathing. This information is not intended to replace advice given to you by your health care provider. Make sure you discuss any questions you have with your health care provider. Document Released: 09/13/2015 Document Revised: 01/13/2016 Document Reviewed: 09/13/2015 Elsevier Interactive Patient Education  [employer].

## 2017-02-15 ENCOUNTER — Ambulatory Visit (HOSPITAL_COMMUNITY): Payer: Medicare Other

## 2017-02-15 ENCOUNTER — Ambulatory Visit (HOSPITAL_COMMUNITY)
Admission: RE | Admit: 2017-02-15 | Discharge: 2017-02-15 | Disposition: A | Discharge status: Home / Self Care | Payer: Medicare Other | Source: Ambulatory Visit | Attending: Podiatry | Admitting: Podiatry

## 2017-02-15 ENCOUNTER — Encounter (HOSPITAL_COMMUNITY)
Admission: RE | Admit: 2017-02-15 | Discharge: 2017-02-15 | Disposition: A | Discharge status: Home / Self Care | Payer: Medicare Other | Source: Ambulatory Visit | Attending: Podiatry | Admitting: Podiatry

## 2017-02-15 ENCOUNTER — Encounter (HOSPITAL_COMMUNITY): Payer: Self-pay

## 2017-02-15 DIAGNOSIS — Z01818 Encounter for other preprocedural examination: Secondary | ICD-10-CM | POA: Diagnosis not present

## 2017-02-15 DIAGNOSIS — Z01810 Encounter for preprocedural cardiovascular examination: Secondary | ICD-10-CM | POA: Diagnosis not present

## 2017-02-15 DIAGNOSIS — Z01812 Encounter for preprocedural laboratory examination: Secondary | ICD-10-CM | POA: Diagnosis not present

## 2017-02-15 DIAGNOSIS — M898X7 Other specified disorders of bone, ankle and foot: Secondary | ICD-10-CM

## 2017-02-15 HISTORY — DX: Personal history of urinary calculi: Z87.442

## 2017-02-15 LAB — CBC WITH DIFFERENTIAL/PLATELET
BASOS ABS: 0 10*3/uL (ref 0.0–0.1)
BASOS PCT: 1 %
EOS ABS: 0.2 10*3/uL (ref 0.0–0.7)
Eosinophils Relative: 3 %
HEMATOCRIT: 38.4 % (ref 36.0–46.0)
Hemoglobin: 12.5 g/dL (ref 12.0–15.0)
Lymphocytes Relative: 34 %
Lymphs Abs: 2.2 10*3/uL (ref 0.7–4.0)
MCH: 29.6 pg (ref 26.0–34.0)
MCHC: 32.6 g/dL (ref 30.0–36.0)
MCV: 91 fL (ref 78.0–100.0)
MONO ABS: 0.5 10*3/uL (ref 0.1–1.0)
Monocytes Relative: 8 %
NEUTROS PCT: 54 %
Neutro Abs: 3.5 10*3/uL (ref 1.7–7.7)
Platelets: 280 10*3/uL (ref 150–400)
RBC: 4.22 MIL/uL (ref 3.87–5.11)
RDW: 12.7 % (ref 11.5–15.5)
WBC: 6.4 10*3/uL (ref 4.0–10.5)

## 2017-02-15 LAB — BASIC METABOLIC PANEL
ANION GAP: 8 (ref 5–15)
BUN: 14 mg/dL (ref 6–20)
CALCIUM: 9.5 mg/dL (ref 8.9–10.3)
CO2: 28 mmol/L (ref 22–32)
Chloride: 104 mmol/L (ref 101–111)
Creatinine, Ser: 0.61 mg/dL (ref 0.44–1.00)
GFR calc non Af Amer: >60 mL/min (ref >60)
Glucose, Bld: 101 mg/dL — ABNORMAL HIGH (ref 65–99)
Potassium: 4.8 mmol/L (ref 3.5–5.1)
SODIUM: 140 mmol/L (ref 135–145)

## 2017-02-15 LAB — SURGICAL PCR SCREEN
MRSA, PCR: NEGATIVE
STAPHYLOCOCCUS AUREUS: NEGATIVE

## 2017-02-17 ENCOUNTER — Inpatient Hospital Stay (HOSPITAL_COMMUNITY): Admission: RE | Admit: 2017-02-17 | Payer: Self-pay | Source: Ambulatory Visit

## 2017-02-22 ENCOUNTER — Ambulatory Visit (HOSPITAL_COMMUNITY): Payer: Medicare Other | Admitting: Anesthesiology

## 2017-02-22 ENCOUNTER — Ambulatory Visit (HOSPITAL_COMMUNITY): Payer: Medicare Other

## 2017-02-22 ENCOUNTER — Ambulatory Visit (HOSPITAL_COMMUNITY)
Admission: RE | Admit: 2017-02-22 | Discharge: 2017-02-22 | Disposition: A | Discharge status: Other Acute Inpatient Hospital | Payer: Medicare Other | Source: Ambulatory Visit | Attending: Podiatry | Admitting: Podiatry

## 2017-02-22 ENCOUNTER — Encounter (HOSPITAL_COMMUNITY)
Admission: RE | Disposition: A | Discharge status: Other Acute Inpatient Hospital | Payer: Self-pay | Source: Ambulatory Visit | Attending: Podiatry

## 2017-02-22 DIAGNOSIS — S86012A Strain of left Achilles tendon, initial encounter: Secondary | ICD-10-CM | POA: Diagnosis not present

## 2017-02-22 DIAGNOSIS — X58XXXA Exposure to other specified factors, initial encounter: Secondary | ICD-10-CM | POA: Diagnosis not present

## 2017-02-22 DIAGNOSIS — I10 Essential (primary) hypertension: Secondary | ICD-10-CM | POA: Insufficient documentation

## 2017-02-22 DIAGNOSIS — Z87891 Personal history of nicotine dependence: Secondary | ICD-10-CM | POA: Insufficient documentation

## 2017-02-22 DIAGNOSIS — M899 Disorder of bone, unspecified: Secondary | ICD-10-CM | POA: Insufficient documentation

## 2017-02-22 DIAGNOSIS — Z9889 Other specified postprocedural states: Secondary | ICD-10-CM

## 2017-02-22 HISTORY — PX: ACHILLES TENDON SURGERY: SHX542

## 2017-02-22 HISTORY — PX: CALCANEAL OSTEOTOMY: SHX1281

## 2017-02-22 SURGERY — REPAIR, TENDON, ACHILLES
Anesthesia: General | Site: Foot | Laterality: Left

## 2017-02-22 MED ORDER — LIDOCAINE HCL (CARDIAC) 10 MG/ML IV SOLN
INTRAVENOUS | Status: DC | PRN
  Administered 2017-02-22: 50 mg via INTRAVENOUS

## 2017-02-22 MED ORDER — ONDANSETRON HCL 4 MG/2ML IJ SOLN
INTRAMUSCULAR | Status: AC
  Filled 2017-02-22: qty 2

## 2017-02-22 MED ORDER — MIDAZOLAM HCL 2 MG/2ML IJ SOLN
INTRAMUSCULAR | Status: AC
  Filled 2017-02-22: qty 2

## 2017-02-22 MED ORDER — SODIUM CHLORIDE 0.9 % IR SOLN
Status: DC | PRN
  Administered 2017-02-22: 1000 mL

## 2017-02-22 MED ORDER — CEFAZOLIN SODIUM-DEXTROSE 2-4 GM/100ML-% IV SOLN
2.0000 g | INTRAVENOUS | Status: AC
  Administered 2017-02-22: 2 g via INTRAVENOUS
  Filled 2017-02-22: qty 100

## 2017-02-22 MED ORDER — MIDAZOLAM HCL 2 MG/2ML IJ SOLN
1.0000 mg | INTRAMUSCULAR | Status: AC
  Administered 2017-02-22: 2 mg via INTRAVENOUS

## 2017-02-22 MED ORDER — GLYCOPYRROLATE 0.2 MG/ML IJ SOLN
INTRAMUSCULAR | Status: DC | PRN
Start: 2017-02-22 — End: 2017-02-22
  Administered 2017-02-22: 0.2 mg via INTRAVENOUS

## 2017-02-22 MED ORDER — FENTANYL CITRATE (PF) 100 MCG/2ML IJ SOLN
INTRAMUSCULAR | Status: DC | PRN
  Administered 2017-02-22: 50 ug via INTRAVENOUS

## 2017-02-22 MED ORDER — GLYCOPYRROLATE 0.2 MG/ML IJ SOLN
INTRAMUSCULAR | Status: AC
  Filled 2017-02-22: qty 2

## 2017-02-22 MED ORDER — LACTATED RINGERS IV SOLN
INTRAVENOUS | Status: DC
  Administered 2017-02-22 (×2): via INTRAVENOUS

## 2017-02-22 MED ORDER — CHLORHEXIDINE GLUCONATE CLOTH 2 % EX PADS: 6.0000 | MEDICATED_PAD | Freq: Once | CUTANEOUS | Status: DC

## 2017-02-22 MED ORDER — NEOSTIGMINE METHYLSULFATE 10 MG/10ML IV SOLN
INTRAVENOUS | Status: AC
  Filled 2017-02-22: qty 1

## 2017-02-22 MED ORDER — LIDOCAINE HCL (PF) 1 % IJ SOLN
INTRAMUSCULAR | Status: AC
  Filled 2017-02-22: qty 15

## 2017-02-22 MED ORDER — LIDOCAINE HCL (PF) 1 % IJ SOLN
INTRAMUSCULAR | Status: AC
  Filled 2017-02-22: qty 30

## 2017-02-22 MED ORDER — BUPIVACAINE HCL (PF) 0.5 % IJ SOLN
INTRAMUSCULAR | Status: AC
Start: 2017-02-22 — End: ?
  Filled 2017-02-22: qty 30

## 2017-02-22 MED ORDER — DEXAMETHASONE SODIUM PHOSPHATE 4 MG/ML IJ SOLN
4.0000 mg | INTRAMUSCULAR | Status: AC
  Administered 2017-02-22: 4 mg via INTRAVENOUS

## 2017-02-22 MED ORDER — NEOSTIGMINE METHYLSULFATE 10 MG/10ML IV SOLN
INTRAVENOUS | Status: DC | PRN
  Administered 2017-02-22: 1 mg via INTRAVENOUS

## 2017-02-22 MED ORDER — BUPIVACAINE HCL (PF) 0.5 % IJ SOLN
INTRAMUSCULAR | Status: DC | PRN
  Administered 2017-02-22: 10 mL
  Administered 2017-02-22: 20 mL

## 2017-02-22 MED ORDER — BUPIVACAINE HCL (PF) 0.5 % IJ SOLN
INTRAMUSCULAR | Status: AC
  Filled 2017-02-22: qty 30

## 2017-02-22 MED ORDER — ONDANSETRON HCL 4 MG/2ML IJ SOLN
4.0000 mg | Freq: Once | INTRAMUSCULAR | Status: AC
  Administered 2017-02-22: 4 mg via INTRAVENOUS

## 2017-02-22 MED ORDER — ROCURONIUM BROMIDE 50 MG/5ML IV SOLN
INTRAVENOUS | Status: AC
  Filled 2017-02-22: qty 1

## 2017-02-22 MED ORDER — PROPOFOL 10 MG/ML IV BOLUS
INTRAVENOUS | Status: DC | PRN
  Administered 2017-02-22: 150 mg via INTRAVENOUS

## 2017-02-22 MED ORDER — DEXAMETHASONE SODIUM PHOSPHATE 4 MG/ML IJ SOLN
INTRAMUSCULAR | Status: AC
  Filled 2017-02-22: qty 1

## 2017-02-22 MED ORDER — ROCURONIUM BROMIDE 100 MG/10ML IV SOLN
INTRAVENOUS | Status: DC | PRN
  Administered 2017-02-22: 35 mg via INTRAVENOUS
  Administered 2017-02-22: 5 mg via INTRAVENOUS

## 2017-02-22 MED ORDER — PROPOFOL 10 MG/ML IV BOLUS
INTRAVENOUS | Status: AC
  Filled 2017-02-22: qty 40

## 2017-02-22 MED ORDER — FENTANYL CITRATE (PF) 250 MCG/5ML IJ SOLN
INTRAMUSCULAR | Status: AC
  Filled 2017-02-22: qty 5

## 2017-02-22 MED ORDER — FENTANYL CITRATE (PF) 100 MCG/2ML IJ SOLN
INTRAMUSCULAR | Status: AC
  Filled 2017-02-22: qty 2

## 2017-02-22 MED ORDER — FENTANYL CITRATE (PF) 100 MCG/2ML IJ SOLN
25.0000 ug | INTRAMUSCULAR | Status: DC | PRN
  Administered 2017-02-22 (×3): 50 ug via INTRAVENOUS
  Filled 2017-02-22 (×2): qty 2

## 2017-02-22 MED ORDER — SEVOFLURANE IN SOLN
RESPIRATORY_TRACT | Status: AC
  Filled 2017-02-22: qty 250

## 2017-02-22 SURGICAL SUPPLY — 54 items
BAG HAMPER (MISCELLANEOUS) ×3 IMPLANT
BANDAGE ELASTIC 4 LF NS (GAUZE/BANDAGES/DRESSINGS) ×3 IMPLANT
BANDAGE ELASTIC 4 VELCRO NS (GAUZE/BANDAGES/DRESSINGS) ×3 IMPLANT
BANDAGE ELASTIC 6 LF NS (GAUZE/BANDAGES/DRESSINGS) ×3 IMPLANT
BANDAGE ESMARK 4X12 BL STRL LF (DISPOSABLE) ×2 IMPLANT
BENZOIN TINCTURE PRP APPL 2/3 (GAUZE/BANDAGES/DRESSINGS) ×3 IMPLANT
BLADE AVERAGE 25X9 (BLADE) ×3 IMPLANT
BLADE SURG 15 STRL LF DISP TIS (BLADE) ×4 IMPLANT
BLADE SURG 15 STRL SS (BLADE) ×2
BNDG CONFORM 2 STRL LF (GAUZE/BANDAGES/DRESSINGS) ×3 IMPLANT
BNDG ESMARK 4X12 BLUE STRL LF (DISPOSABLE) ×3
BNDG GAUZE ELAST 4 BULKY (GAUZE/BANDAGES/DRESSINGS) ×3 IMPLANT
BOOT STEPPER DURA LG (SOFTGOODS) IMPLANT
BOOT STEPPER DURA MED (SOFTGOODS) IMPLANT
BOOT STEPPER DURA SM (SOFTGOODS) IMPLANT
BOOT STEPPER DURA XLG (SOFTGOODS) IMPLANT
CHLORAPREP W/TINT 26ML (MISCELLANEOUS) ×3 IMPLANT
CLOTH BEACON ORANGE TIMEOUT ST (SAFETY) ×3 IMPLANT
COVER LIGHT HANDLE STERIS (MISCELLANEOUS) ×6 IMPLANT
CUFF TOURNIQUET SINGLE 34IN LL (TOURNIQUET CUFF) ×3 IMPLANT
DECANTER SPIKE VIAL GLASS SM (MISCELLANEOUS) ×3 IMPLANT
DRAPE OEC MINIVIEW 54X84 (DRAPES) ×3 IMPLANT
DRSG ADAPTIC 3X8 NADH LF (GAUZE/BANDAGES/DRESSINGS) ×3 IMPLANT
ELECT REM PT RETURN 9FT ADLT (ELECTROSURGICAL) ×3
ELECTRODE REM PT RTRN 9FT ADLT (ELECTROSURGICAL) ×2 IMPLANT
GAUZE SPONGE 4X4 12PLY STRL (GAUZE/BANDAGES/DRESSINGS) ×3 IMPLANT
GLOVE BIO SURGEON STRL SZ7.5 (GLOVE) ×3 IMPLANT
GLOVE BIOGEL PI IND STRL 7.0 (GLOVE) ×4 IMPLANT
GLOVE BIOGEL PI INDICATOR 7.0 (GLOVE) ×2
GOWN STRL REUS W/TWL LRG LVL3 (GOWN DISPOSABLE) ×9 IMPLANT
IMPL SYS BIOCOMP ACH SPEED (Anchor) ×2 IMPLANT
IMPLANT SYS BIOCOMP ACH SPEED (Anchor) ×3 IMPLANT
KIT ROOM TURNOVER AP CYSTO (KITS) ×3 IMPLANT
MANIFOLD NEPTUNE II (INSTRUMENTS) ×3 IMPLANT
NEEDLE HYPO 27GX1-1/4 (NEEDLE) ×9 IMPLANT
NEEDLE MA TROC 1/2 (NEEDLE) ×3 IMPLANT
NS IRRIG 1000ML POUR BTL (IV SOLUTION) ×3 IMPLANT
PACK BASIC LIMB (CUSTOM PROCEDURE TRAY) ×3 IMPLANT
PAD ABD 8X10 STRL (GAUZE/BANDAGES/DRESSINGS) ×3 IMPLANT
PAD ARMBOARD 7.5X6 YLW CONV (MISCELLANEOUS) ×3 IMPLANT
PADDING WEBRIL 6 STERILE (GAUZE/BANDAGES/DRESSINGS) ×3 IMPLANT
RASP SM TEAR CROSS CUT (RASP) ×3 IMPLANT
SET BASIN LINEN APH (SET/KITS/TRAYS/PACK) ×3 IMPLANT
SPLINT PLASTER CAST XFAST 5X30 (CAST SUPPLIES) ×2 IMPLANT
SPLINT PLASTER XFAST SET 5X30 (CAST SUPPLIES) ×1
SPONGE LAP 18X18 X RAY DECT (DISPOSABLE) ×3 IMPLANT
STAPLER VISISTAT (STAPLE) ×3 IMPLANT
STRIP CLOSURE SKIN 1/2X4 (GAUZE/BANDAGES/DRESSINGS) ×6 IMPLANT
SUT PROLENE 4 0 PS 2 18 (SUTURE) IMPLANT
SUT VIC AB 2-0 CT2 27 (SUTURE) IMPLANT
SUT VIC AB 4-0 PS2 27 (SUTURE) ×6 IMPLANT
SUT VICRYL AB 3-0 FS1 BRD 27IN (SUTURE) IMPLANT
SYR CONTROL 10ML LL (SYRINGE) ×6 IMPLANT
WATER STERILE IRR 500ML POUR (IV SOLUTION) ×3 IMPLANT

## 2017-02-22 NOTE — H&P (Signed)
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  The various methods of treatment have been discussed with the patient.  No guarantees were given.  After consideration of risks, benefits and other options for treatment, the patient has consented to surgery.  I have reviewed the patients' chart and labs.    Patient Vitals for the past 24 hrs:  BP Temp Temp src Pulse Resp SpO2 Height Weight  02/22/17 0743 116/75 97.6 F (36.4 C) Oral 67 18 93 %  (1.6 m) 190 lb (86.2 kg)    A history and physical examination was performed in my office.  The patient was reexamined.  There have been no changes to this history and physical examination.  Dallas Schimke, DPM

## 2017-02-22 NOTE — Discharge Instructions (Signed)
These instructions will give you an idea of what to expect after surgery and how to manage issues that may arise before your first post op office visit.  Pain Management Pain is best managed by staying ahead of it. If pain gets out of control, it is difficult to get it back under control. Local anesthesia that lasts 6-8 hours is used to numb the foot and decrease pain.  For the best pain control, take the pain medication every 4 hours for the first 2 days post op. On the third day pain medication can be taken as needed.   Post Op Nausea Nausea is common after surgery, so it is managed proactively.  If prescribed, use the prescribed nausea medication regularly for the first 2 days post op.  Bandages Do not worry if there is blood on the bandage. What looks like a lot of blood on the bandage is actually a small amount. Blood on the dressing spreads out as it is absorbed by the gauze, the same way a drop of water spreads out on a paper towel.  If the bandages feel wet or dry, stiff and uncomfortable, call the office during office hours and we will schedule a time for you to have the bandage changed.  Unless you are specifically told otherwise, we will do the first bandage change in the office.  Keep your bandage dry. If the bandage becomes wet or soiled, notify the office and we will schedule a time to change the bandage.  Activity It is best to spend most of the first 2 days after surgery lying down with the foot elevated above the level of your heart. You may NOT put weight on your left lower extremity. You may only get up to go to the restroom.  Driving Do not drive until you are able to respond in an emergency (i.e. slam on the brakes). This usually occurs after the bone has healed - 6 to 8 weeks.  Call the Office If you have a fever over 101F.  If you have increasing pain after the initial post op pain has settled down.  If you have increasing redness, swelling, or drainage.  If you  have any questions or concerns.   Other Start your Lovenox tomorrow.

## 2017-02-22 NOTE — Anesthesia Preprocedure Evaluation (Addendum)
Anesthesia Evaluation  Patient identified by MRN, date of birth, ID band Patient awake    Reviewed: Allergy & Precautions, H&P , NPO status , Patient's Chart, lab work & pertinent test results  History of Anesthesia Complications (+) PONV and history of anesthetic complications  Airway Mallampati: I  TM Distance: >3 FB Neck ROM: Full    Dental  (+) Teeth Intact   Pulmonary neg pulmonary ROS, former smoker,    breath sounds clear to auscultation       Cardiovascular hypertension, Pt. on medications  Rhythm:Regular Rate:Normal     Neuro/Psych PSYCHIATRIC DISORDERS Anxiety    GI/Hepatic negative GI ROS, Neg liver ROS,   Endo/Other  negative endocrine ROS  Renal/GU negative Renal ROS     Musculoskeletal   Abdominal   Peds  Hematology   Anesthesia Other Findings   Reproductive/Obstetrics                             Anesthesia Physical Anesthesia Plan  ASA: II  Anesthesia Plan: General   Post-op Pain Management:    Induction: Intravenous  PONV Risk Score and Plan:   Airway Management Planned: Oral ETT  Additional Equipment:   Intra-op Plan:   Post-operative Plan:   Informed Consent: I have reviewed the patients History and Physical, chart, labs and discussed the procedure including the risks, benefits and alternatives for the proposed anesthesia with the patient or authorized representative who has indicated his/her understanding and acceptance.     Plan Discussed with:   Anesthesia Plan Comments: (Semi prone position)       Anesthesia Quick Evaluation

## 2017-02-22 NOTE — Transfer of Care (Signed)
Immediate Anesthesia Transfer of Care Note  Patient: Krista Fuller  Procedure(s) Performed: Procedure(s) with comments: ACHILLES TENDON REPAIR (Left) - pt knows to arrive at 7:20 RETROCALCANEAL EXOSTECTOMY (Left) - left heel  Patient Location: PACU  Anesthesia Type:General  Level of Consciousness: awake and patient cooperative  Airway & Oxygen Therapy: Patient Spontanous Breathing and Patient connected to nasal cannula oxygen  Post-op Assessment: Report given to RN and Post -op Vital signs reviewed and stable  Post vital signs: Reviewed and stable  Last Vitals:  Vitals:   02/22/17 0820 02/22/17 0830  BP: (!) 149/84   Pulse:    Resp: 19 18  Temp:    SpO2: 95% 97%    Last Pain:  Vitals:   02/22/17 0743  TempSrc: Oral      Patients Stated Pain Goal: 5 (02/22/17 0743)  Complications: No apparent anesthesia complications

## 2017-02-22 NOTE — Anesthesia Procedure Notes (Signed)
Procedure Name: Intubation Date/Time: 02/22/2017 9:07 AM Performed by: Vista Deck Pre-anesthesia Checklist: Patient identified, Patient being monitored, Timeout performed, Emergency Drugs available and Suction available Patient Re-evaluated:Patient Re-evaluated prior to induction Oxygen Delivery Method: Circle System Utilized Preoxygenation: Pre-oxygenation with 100% oxygen Induction Type: IV induction Ventilation: Mask ventilation without difficulty Laryngoscope Size: Mac and 3 Grade View: Grade I Tube type: Oral Tube size: 7.0 mm Number of attempts: 1 Airway Equipment and Method: stylet Placement Confirmation: ETT inserted through vocal cords under direct vision,  positive ETCO2 and breath sounds checked- equal and bilateral Secured at: 22 cm Tube secured with: Tape Dental Injury: Teeth and Oropharynx as per pre-operative assessment

## 2017-02-22 NOTE — Brief Op Note (Signed)
BRIEF OPERATIVE NOTE  DATE OF PROCEDURE 02/22/2017  SURGEON Dallas Schimke, DPM  ASSISTANT SURGEON Delton See, DPM  OR STAFF Circulator: Cox, Wyatt Haste, RN Scrub Person: Illene Silver, CST Circulator Assistant: Lizabeth Leyden, RN   PREOPERATIVE DIAGNOSIS 1.  Retrocalcaneal exostosis, left foot 2.  Intrasubstance tear of achilles tendon, left foot 3.  Pain, left foot  POSTOPERATIVE DIAGNOSIS Same  PROCEDURE 1.  Retrocalcaneal exostectomy, left foot 2.  Repair of Achilles tendon tear, left foot  ANESTHESIA General   HEMOSTASIS Pneumatic thigh tourniquet set at 300 mmHg  ESTIMATED BLOOD LOSS Minimal (<5 cc)  MATERIALS USED None  INJECTABLES 0.5% Marcaine plain  PATHOLOGY None  COMPLICATIONS None

## 2017-02-22 NOTE — Anesthesia Postprocedure Evaluation (Signed)
Anesthesia Post Note  Patient: Ivalee Strauser  Procedure(s) Performed: Procedure(s) (LRB): ACHILLES TENDON REPAIR (Left) RETROCALCANEAL EXOSTECTOMY (Left)  Patient location during evaluation: PACU Anesthesia Type: General Level of consciousness: awake and alert Pain management: satisfactory to patient Vital Signs Assessment: post-procedure vital signs reviewed and stable Respiratory status: spontaneous breathing Cardiovascular status: stable Postop Assessment: no apparent nausea or vomiting Anesthetic complications: no     Last Vitals:  Vitals:   02/22/17 1230 02/22/17 1242  BP: 121/87   Pulse: 80 (!) 53  Resp: 15 11  Temp:    SpO2: 99% 95%    Last Pain:  Vitals:   02/22/17 1242  TempSrc:   PainSc: Asleep                 Binh Doten

## 2017-02-22 NOTE — Op Note (Signed)
OPERATIVE NOTE  DATE OF PROCEDURE 02/22/2017  SURGEON Dallas Schimke, DPM  ASSISTANT SURGEON Delton See, DPM  OR STAFF Circulator: Cox, Wyatt Haste, RN Scrub Person: Illene Silver, CST Circulator Assistant: Lizabeth Leyden, RN   PREOPERATIVE DIAGNOSIS 1.  Retrocalcaneal exostosis, left foot 2.  Intrasubstance tear of achilles tendon, left foot 3.  Pain, left foot  POSTOPERATIVE DIAGNOSIS Same  PROCEDURE 1.  Retrocalcaneal exostectomy, left foot 2.  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  Symptoms failed to improve with period immobilization.  MRI revelaed an intrasubstance tear of the left Achilles tendon.  DESCRIPTION OF THE PROCEDURE:  The patient was brought to the operating room.  General anesthesia was administered.  The patient was placed on the operative table in the semi-prone position.  A pneumatic thigh tourniquet was applied to the operative extremity.  The left lower extremity was prepped, scrubbed, and draped in the usual sterile technique.  The left lower extremity was elevated, exsanguinated and the pneumatic thigh tourniquet inflated to 300 mmHg.    Attention was directed to the posterior aspect of the left lower extremity.  A linear midline incision was made along the posterior aspect of the left heel.  The incision was carried down to the calcaneus and Achilles tendon.  A linear incision was made the peritenon.  Peritenon was reflected medially and laterally exposing the Achilles tendon.  The Achilles tendon was split, full-thickness from posterior to anterior.  The Achilles tendon was released distally and reflected medially and laterally to expose the retrocalcaneal exostosis.   The medial and lateral attachments of the Achilles tendon were maintained.  An intrasubstance tear was identified.  Tendon was debrided removing all tendinopathic tissue.  Using a power bone saw the retrocalcaneal exostosis was resected and passed from the operative field.  All rough edges were smoothed with a bone rasp.  The resection was confirmed with fluoroscopy.  The bone was prepared for insertion of (2) 4.75 mm bio composite swivel lock anchors 1 cm proximal to the Achilles insertion.  The swivel lock anchors were inserted.  The fiber tape suture was passed through the corresponding portion of the Achilles tendon.  The distal holes were prepared with 3.5 mm drill bit.  The fiber tape suture was inserted under appropriate tension and secured with 4.75 mm swivel lock anchor.  The intrasubstance tear was repaired using #2 FiberWire.  The midline Achilles tendon incision was closed with #2 FiberWire.  The surgical wound was irrigated with copious amounts of sterile irrigant.  The sub-cutaneous structures were reapproximated using 4-0 Vicryl.  The skin was reapproximated using skin staples.  The surgical site was anesthetized with 0.5% Marcaine plain.  A sterile compressive dressing was applied to the left lower extremity.  A below-knee splint was applied to the left lower extremity.  The pneumatic thigh tourniquet was deflated and a prompt hyperemic response was noted to all digits of the operative foot.  The patient tolerated the procedure well.  She was transferred from the operating room to the postanesthesia care unit with vital signs stable.

## 2017-02-23 ENCOUNTER — Encounter (HOSPITAL_COMMUNITY): Payer: Self-pay | Admitting: Podiatry

## 2018-01-11 IMAGING — DX DG FOOT COMPLETE 3+V*L*
3 series · 3 of 3 positions shown · non-contrast
Comparison: None.

CLINICAL DATA: Preop for Achilles tendon repair.

EXAM:
LEFT FOOT - COMPLETE 3+ VIEW

[foot ap]
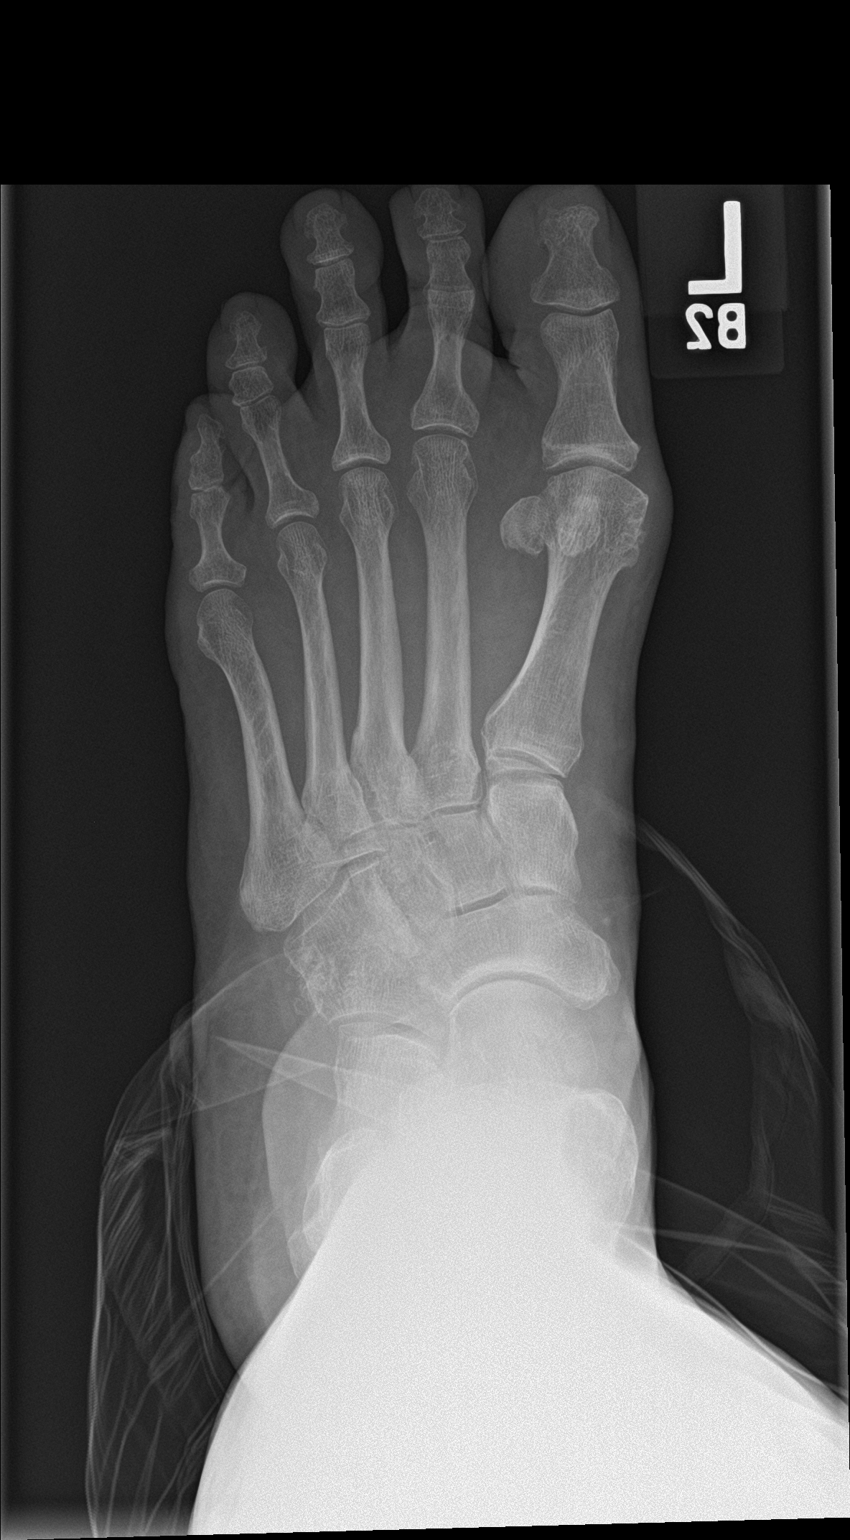

[foot obl]
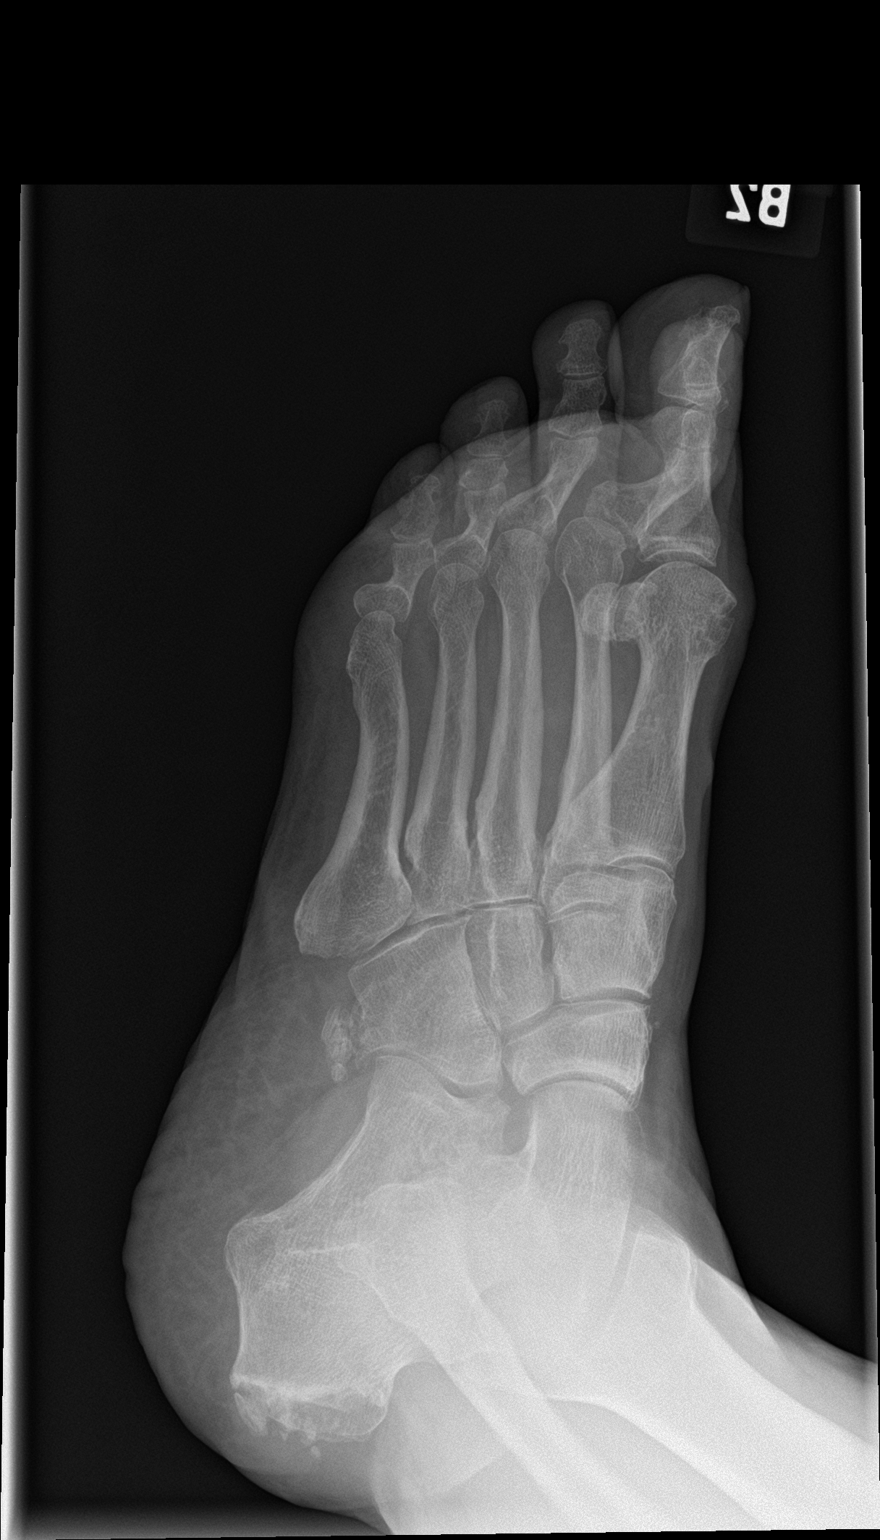

[foot lat]
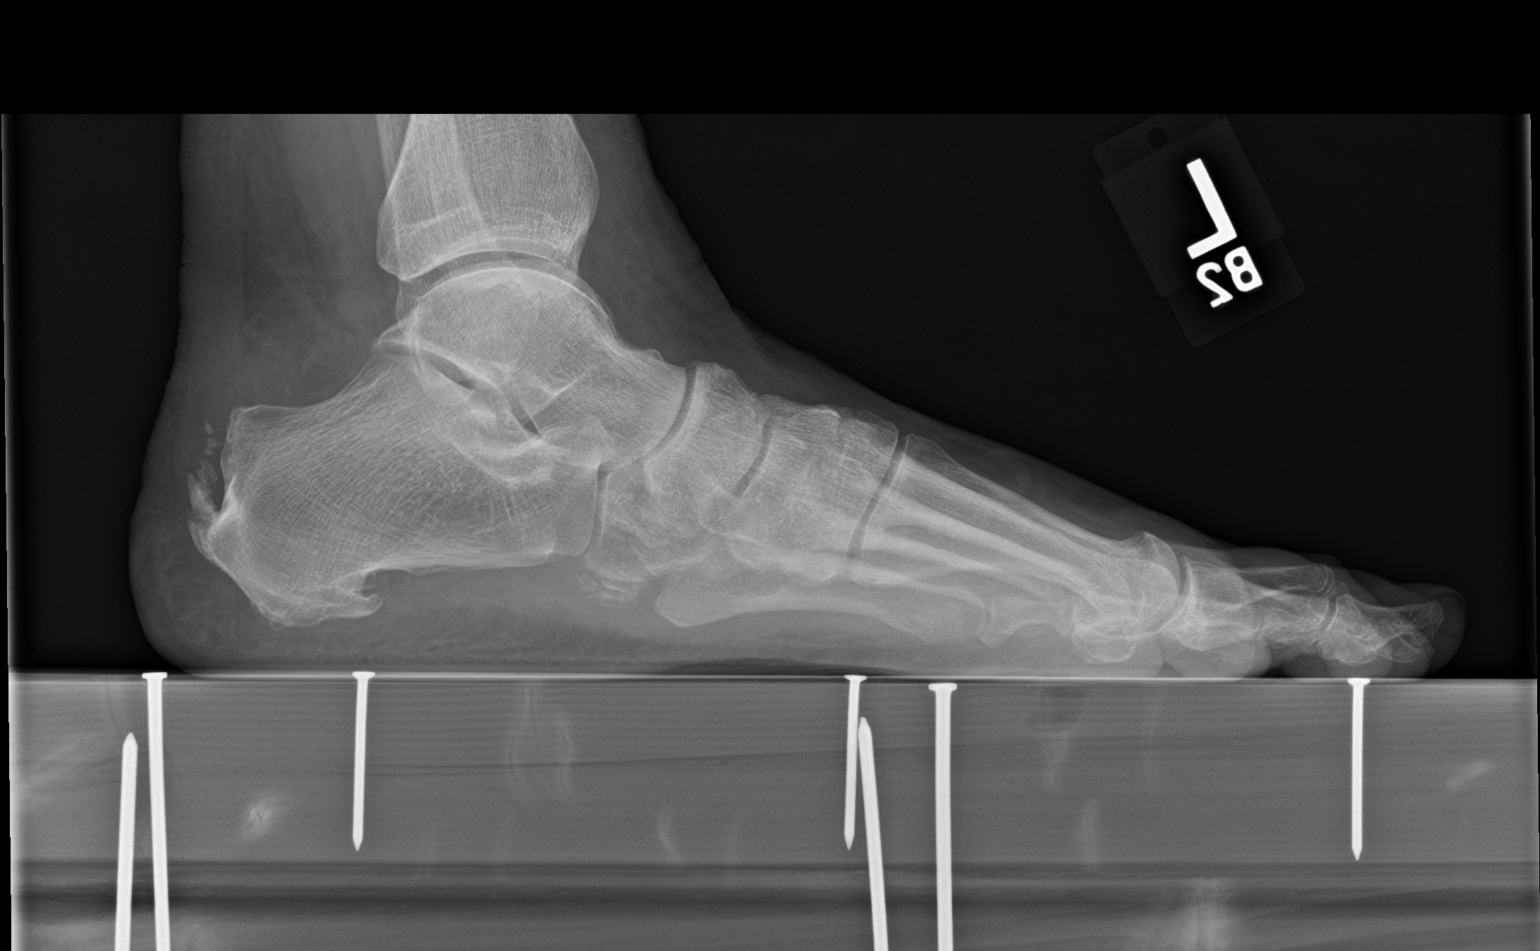

[3 of 3 positions shown; findings below may reference images not displayed]

FINDINGS: No acute bony findings are demonstrated. The distal Achilles tendons
appears thickened and contains calcifications consistent with
chronic calcific tendinitis. There is also a moderate size calcaneal
heel spur. Os perineum noted.
IMPRESSION: Calcific tendinopathy involving the distal Achilles tendon.

## 2018-01-18 IMAGING — CR DG FOOT COMPLETE 3+V*L*
1 series · 3 of 3 positions shown · non-contrast
Comparison: 02/15/2017

CLINICAL DATA: Achilles tendon repair, retrocalcaneal exostectomy.

EXAM:
LEFT FOOT - COMPLETE 3+ VIEW

[Series 1: ap · 0.17mm/px · 3 of 3 slices shown]
[im 1/3]
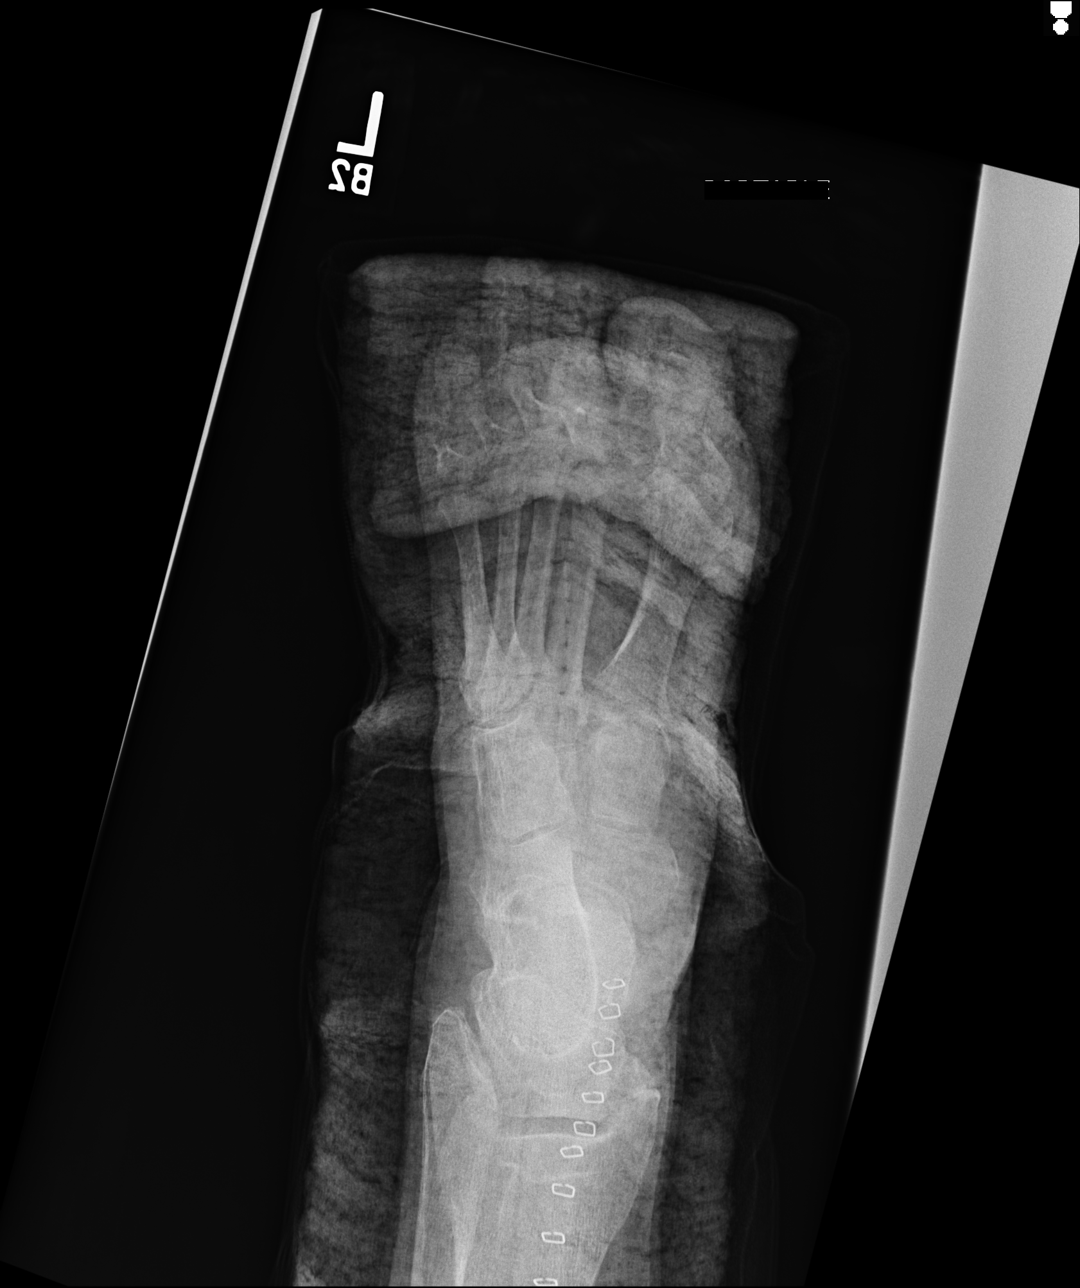
[im 2/3]
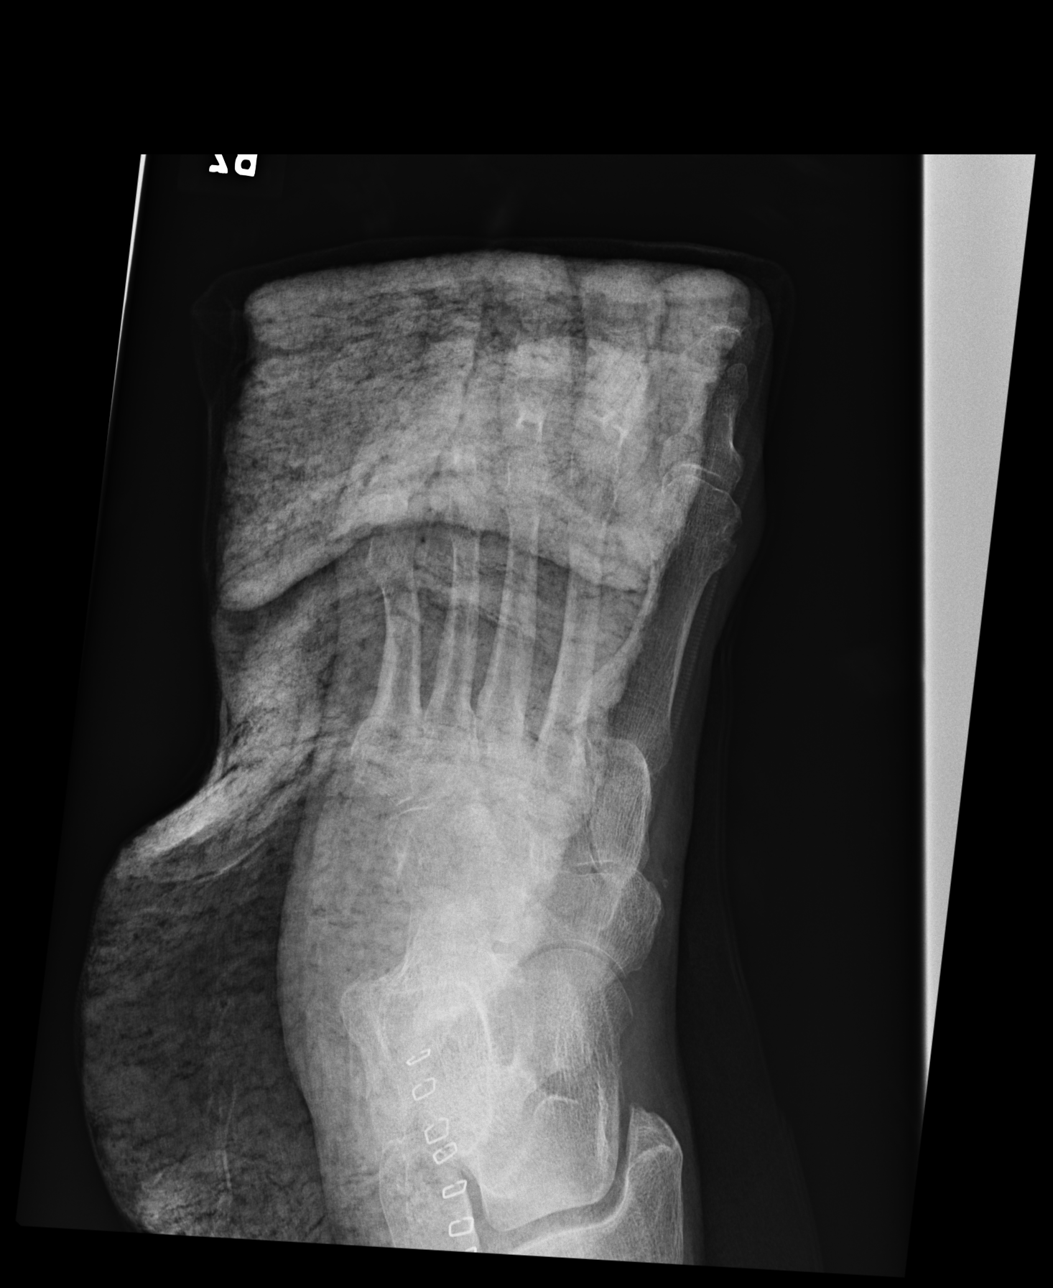
[im 3/3]
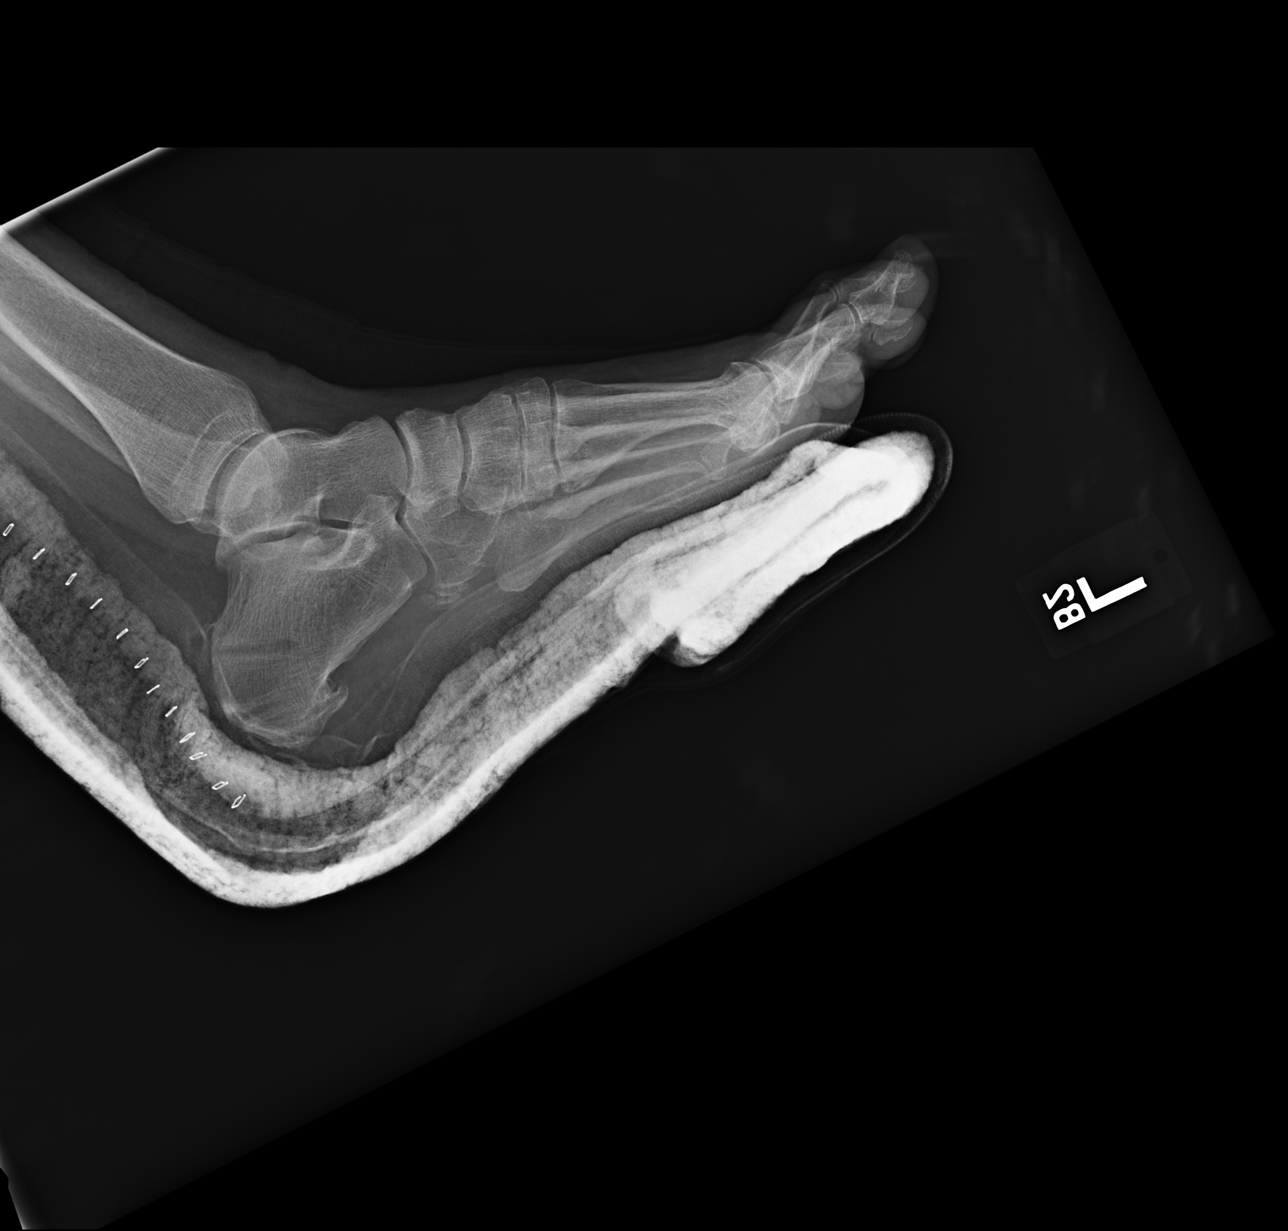

[3 of 3 positions shown; findings below may reference images not displayed]

FINDINGS: Bony detail obscured by the plaster splint.

The retrocalcaneal spur and calcifications are no longer present.
Several linear lucencies are present in the posterior calcaneus
likely from attachment sites of the Achilles tendon. No obvious
complicating feature.

Plantar calcaneal spur.  Multipartite os peroneus.
IMPRESSION: 1. Removal of Achilles spur and calcifications, with lucencies in
the calcaneus likely reflecting fixation sites of the Achilles
tendon to the calcaneus. No complicating feature.
2. Plantar calcaneal spur.
